# Patient Record
Sex: Female | Born: 2000 | Race: White | Hispanic: No | Marital: Single | Smoking: Former smoker
Health system: Southern US, Community
[De-identification: ages and names within clinical notes are randomized; demographics above are authoritative.]

## PROBLEM LIST (undated history)

## (undated) ENCOUNTER — Inpatient Hospital Stay (HOSPITAL_COMMUNITY): Payer: Self-pay

## (undated) DIAGNOSIS — D649 Anemia, unspecified: Secondary | ICD-10-CM

## (undated) DIAGNOSIS — J45909 Unspecified asthma, uncomplicated: Secondary | ICD-10-CM

## (undated) DIAGNOSIS — Z720 Tobacco use: Secondary | ICD-10-CM

## (undated) HISTORY — DX: Tobacco use: Z72.0

## (undated) HISTORY — DX: Anemia, unspecified: D64.9

---
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
## 2018-04-17 ENCOUNTER — Encounter: Payer: Self-pay | Admitting: Medical

## 2018-04-17 ENCOUNTER — Ambulatory Visit: Payer: 59 | Admitting: Medical

## 2018-04-17 ENCOUNTER — Telehealth: Payer: Self-pay | Admitting: Medical

## 2018-04-17 VITALS — BP 112/70 | HR 112 | Temp 98.1°F | Resp 16 | Ht 66.5 in | Wt 178.4 lb

## 2018-04-17 DIAGNOSIS — F329 Major depressive disorder, single episode, unspecified: Secondary | ICD-10-CM

## 2018-04-17 DIAGNOSIS — R4589 Other symptoms and signs involving emotional state: Secondary | ICD-10-CM

## 2018-04-17 DIAGNOSIS — F419 Anxiety disorder, unspecified: Secondary | ICD-10-CM

## 2018-04-17 DIAGNOSIS — R4586 Emotional lability: Secondary | ICD-10-CM | POA: Diagnosis not present

## 2018-04-17 MED ORDER — CITALOPRAM HYDROBROMIDE 20 MG PO TABS: ORAL_TABLET | ORAL | 2 refills | Status: DC

## 2018-04-17 MED ORDER — ZOLPIDEM TARTRATE 5 MG PO TABS: 5.0000 mg | ORAL_TABLET | Freq: Every evening | ORAL | 0 refills | Status: DC | PRN

## 2018-04-17 NOTE — Progress Notes (Signed)
Subjective: Chief Complaint  Patient presents with  . np anxiety    new/ old pt anxiety    Here to re-establish care.  She has not been here in several years for care.  Her parents encouraged her to come in.  Here to discuss anxiety, mood.  In recent months she notes that she has had anxiety attacks, at times will get trembly, chest tightness will pull over on the highway at times this happens trying to get herself to calm down.  She has no interest in doing anything, has trouble sleeping at times and then other times can sleep for 2 to 3 days not doing much at all around the house.  She notes not having an appetite, no desire to engage with people, keeps to herself of late.  Currently is living in a house with her aunt grandmother and cousin but they are moving out.  Her parents are paying for the house currently in providing her resources for now.  Her parents live in [place].  She talks with them somewhat regularly on the phone but sees them every several months  She notes that she got pregnant at 66 years old, had a baby that had congenital heart defects who was in the hospital all but 1 month of the life ended up passing away 79 months old.  She notes that she has had these feelings of anxiety and lack of desire for things since getting pregnant.  She has never been diagnosed with depression or anxiety or other mental health issues in the past.  She does think that there are mental health issues in the family including sister, grandmother and mother have been diagnosed with anxiety and depression  She tried talking with her pastor who is a Veterinary surgeon but did not feel like that was helpful at all.  She has not seen any other mental health provider.  She is not working or going to school.  Was working at [employer] for a while in 2018, was working [employer] briefly this past year  Not exercising.  She notes that she mainly stays at home.  No SI/no HI. No other aggravating or relieving factors.  No other complaint.  Past Medical History:  Diagnosis Date  . Asthma    No current outpatient medications on file prior to visit.   No current facility-administered medications on file prior to visit.    ROS as in subjective     Objective: BP 112/70   Pulse (!) 112   Temp 98.1 F (36.7 C) (Oral)   Resp 16   Ht 5' 6.5" (1.689 m)   Wt 178 lb 6.4 oz (80.9 kg)   SpO2 98%   BMI 28.36 kg/m   Gen: wd, wn, nad Psych: smiling, good eye contact, but affect seems more apathetic Lungs clear Heart rrr, normal s1, s2, no murmurs Neck: supple, nontender, no mass, no lymphotrophy, no thyromegaly Pulses WNL    Assessment: Encounter Diagnoses  Name Primary?  . Depressed mood Yes  . Anxiety   . Mood swing      Plan: I reviewed her mood questionnaire and PHQ 9.  Negative screen on mood questionnaire.  Her symptoms suggest depressed mood and anxiety, some panic attacks.  She has positive anhedonia, poor sleep hygiene, excessive sleep at times, lack of motivation, mood swings, and somewhat of an apathetic affect.  Strongly encouraged her to consider counseling, discussed the goals and benefits of counseling, discussed possible medicines that may help.  Begin medications  as below, Ambien short-term.  Discussed risk and benefits of medications proper use of medications.  Discussed coping skills for anxiety and ways to deal with acute panic attack.  Encouraged her to find a hobby or consider having to interaction with a good friend regularly.  Plan follow-up in 2 weeks, sooner as needed  Prudence was seen today for np anxiety.  Diagnoses and all orders for this visit:  Depressed mood  Anxiety  Mood swing  Other orders -     citalopram (CELEXA) 20 MG tablet; 1/2 tablet daily QHS x1wk, then 1 tablet po QHS -     zolpidem (AMBIEN) 5 MG tablet; Take 1 tablet (5 mg total) by mouth at bedtime as needed for sleep.

## 2018-04-17 NOTE — Telephone Encounter (Signed)
Pt's mom, Kristin Sutton, called stating that pt has been experiencing severe anxiety and/or depression since she loss her baby a few months ago so that is why mom made the appointment for pt to be seen today. Mom wants to make sure that Kristin Sutton encourages pt to come back for a CPE because mom has been told that pt has a lump in her breast and vaginal area bumps so she needs a complete CPE. Per mom, pt has let herself go mentally and physically and she is trying to help get her on track. Mom does not want pt to know that she revealed of of this information to [name].

## 2018-04-17 NOTE — Telephone Encounter (Signed)
Please call mom back and tell her thank you for call and check in on her.  We did have a relatively good discussion today, and I asked her to come back in 2 weeks for follow-up.  However yes I also agree she needs help.  Have mom encourage her to establish with a therapist as I did as well.   Have mom encourage her to come back for physical.  Tammy - please call her back and make her a 2 week follow up appt on mood or physical appt.

## 2018-04-17 NOTE — Patient Instructions (Signed)
RESOURCES in [place], [place]  If you are experiencing a mental health crisis or an emergency, please call 911 or go to the nearest emergency department.  [facility]   [phone] [facility]  [phone] [facility]   [phone]  Suicide Hotline 1-800-Suicide (1-800-784-2433)  National Suicide Prevention Lifeline 1-800-273-TALK  (1-800-273-8255)  Domestic Violence, Rape/Crisis - [facility] 336-273-7273  The National Domestic Violence Hotline 1-800-799-SAFE (1-800-799-7233)  To report Child or Elder Abuse, please call: [place] [employer] Department  [phone] [place] [employer]  [phone]  LGBT Youth Crisis Line 1-866-488-7386  Teen Crisis line 336-387-6161 or 1-877-332-7333     Psychiatry and Counseling services  [facility] [address], [place], [place] [address] [phone]  Holly Ingram, therapist Dr. Carey Cottle, psychiatrist Dr. Glenn Jennings, child psychiatrist   Dr. David Fuller [address], [place], [place] [address] [phone]   Dr. Rupinder Kaur, psychiatry [address], [place], [place] [address] [phone]   [facility] [address], [place], [place] [address] [phone]   [facility] [address], [place], [place] [address] [phone]    Counseling Services (NON- psychiatrist offices)  [facility] [address], [place], [place] [address] [phone]   [facility] [phone] [address], [place], [place] [address]   [facility] [phone]  [email or web address] [address], [place], [place] [address]   Merrianne M. Leff, therapist [phone] [address] [place], [place] [address]   [facility] [phone] [address], [place],   [address]   Jill White-Huffman, therapist [phone] [address], [place],  [address]   [facility] [phone] [address], [place],  [address]   

## 2018-04-21 NOTE — Telephone Encounter (Signed)
Have called mom twice and left messages to call our office

## 2018-04-24 ENCOUNTER — Ambulatory Visit: Payer: 59 | Admitting: Medical

## 2018-04-24 VITALS — BP 110/70 | HR 95 | Temp 98.1°F | Resp 16 | Ht 66.5 in | Wt 181.8 lb

## 2018-04-24 DIAGNOSIS — L03011 Cellulitis of right finger: Secondary | ICD-10-CM | POA: Diagnosis not present

## 2018-04-24 DIAGNOSIS — F321 Major depressive disorder, single episode, moderate: Secondary | ICD-10-CM | POA: Diagnosis not present

## 2018-04-24 DIAGNOSIS — F419 Anxiety disorder, unspecified: Secondary | ICD-10-CM

## 2018-04-24 MED ORDER — AMOXICILLIN-POT CLAVULANATE 875-125 MG PO TABS: 1.0000 | ORAL_TABLET | Freq: Two times a day (BID) | ORAL | 0 refills | Status: DC

## 2018-04-24 NOTE — Patient Instructions (Signed)
Paronychia  Paronychia is an infection of the skin. It happens near a fingernail or toenail. It may cause pain and swelling around the nail. Usually, it is not serious and it clears up with treatment.  Follow these instructions at home:   Soak the fingers or toes in warm water as told by your doctor. You may be told to do this for 20 minutes, 2-3 times a day.   Keep the area dry when you are not soaking it.   Take medicines only as told by your doctor.   If you were given an antibiotic medicine, finish all of it even if you start to feel better.   Keep the affected area clean.   Do not try to drain a fluid-filled bump yourself.   Wear rubber gloves when putting your hands in water.   Wear gloves if your hands might touch cleaners or chemicals.   Follow your doctor's instructions about:  ? Wound care.  ? Bandage (dressing) changes and removal.  Contact a doctor if:   Your symptoms get worse or do not improve.   You have a fever or chills.   You have redness spreading from the affected area.   You have more fluid, blood, or pus coming from the affected area.   Your finger or knuckle is swollen or is hard to move.  This information is not intended to replace advice given to you by your health care provider. Make sure you discuss any questions you have with your health care provider.  Document Released: 07/17/2009 Document Revised: 01/04/2016 Document Reviewed: 07/06/2014  Elsevier Interactive Patient Education  2018 Elsevier Inc.

## 2018-04-24 NOTE — Progress Notes (Signed)
Subjective: Chief Complaint  Patient presents with  . infected finger    right ring finger infected X 2 weeks   Here for right finger infection.  She was biting her nails couple weeks ago started getting redness and swelling of the right ring finger.  It has worsened in the last week, getting pus out of it daily.  She is using warm soaks.  No fever.  Since last visit she called a few counselors, but has not yet made an appointment, she started the medication to help with depression and is having some nausea but is still taking this  Past Medical History:  Diagnosis Date  . Asthma    Current Outpatient Medications on File Prior to Visit  Medication Sig Dispense Refill  . citalopram (CELEXA) 20 MG tablet 1/2 tablet daily QHS x1wk, then 1 tablet po QHS 30 tablet 2  . zolpidem (AMBIEN) 5 MG tablet Take 1 tablet (5 mg total) by mouth at bedtime as needed for sleep. 15 tablet 0   No current facility-administered medications on file prior to visit.    ROS as in subjective   Objective: BP 110/70   Pulse 95   Temp 98.1 F (36.7 C) (Oral)   Resp 16   Ht 5' 6.5" (1.689 m)   Wt 181 lb 12.8 oz (82.5 kg)   SpO2 98%   BMI 28.90 kg/m    Gen:wd, wn, nad Skin: medial nail bed of right 4th finger with redness, swelling, quite tender but no pus pocket currently Fingers neurovascularly intact Psych: Pleasant, good eye contact, answers questions appropriately   Assessment: Encounter Diagnoses  Name Primary?  . Paronychia of finger of right hand Yes  . Anxiety   . Depression, major, single episode, moderate (HCC)     Plan: Continue hot soapy water soaks of the finger 2-3 times daily, begin medication below, can use Neosporin topically, and if not much improved within 10 days call back  Anxiety, depression -glad that she is seeing some improvement in mood, has been more receptive to talking about her concerns in the last week, continue citalopram, again encouraged her to get a counselor  ASAP.   Bonner PunaJazlyn was seen today for infected finger.  Diagnoses and all orders for this visit:  Paronychia of finger of right hand  Anxiety  Depression, major, single episode, moderate (HCC)  Other orders -     amoxicillin-clavulanate (AUGMENTIN) 875-125 MG tablet; Take 1 tablet by mouth 2 (two) times daily.

## 2018-05-12 ENCOUNTER — Ambulatory Visit: Payer: 59 | Admitting: Medical

## 2018-05-12 ENCOUNTER — Encounter: Payer: Self-pay | Admitting: Medical

## 2018-05-12 VITALS — BP 110/70 | HR 91 | Temp 97.6°F | Resp 16 | Ht 67.0 in | Wt 182.6 lb

## 2018-05-12 DIAGNOSIS — N63 Unspecified lump in unspecified breast: Secondary | ICD-10-CM | POA: Insufficient documentation

## 2018-05-12 DIAGNOSIS — Z0000 Encounter for general adult medical examination without abnormal findings: Secondary | ICD-10-CM

## 2018-05-12 DIAGNOSIS — N926 Irregular menstruation, unspecified: Secondary | ICD-10-CM | POA: Diagnosis not present

## 2018-05-12 DIAGNOSIS — Z8742 Personal history of other diseases of the female genital tract: Secondary | ICD-10-CM | POA: Diagnosis not present

## 2018-05-12 DIAGNOSIS — Z3201 Encounter for pregnancy test, result positive: Secondary | ICD-10-CM | POA: Insufficient documentation

## 2018-05-12 DIAGNOSIS — N912 Amenorrhea, unspecified: Secondary | ICD-10-CM

## 2018-05-12 DIAGNOSIS — K5909 Other constipation: Secondary | ICD-10-CM

## 2018-05-12 DIAGNOSIS — R102 Pelvic and perineal pain: Secondary | ICD-10-CM

## 2018-05-12 DIAGNOSIS — F321 Major depressive disorder, single episode, moderate: Secondary | ICD-10-CM

## 2018-05-12 DIAGNOSIS — Z113 Encounter for screening for infections with a predominantly sexual mode of transmission: Secondary | ICD-10-CM | POA: Diagnosis not present

## 2018-05-12 DIAGNOSIS — Z720 Tobacco use: Secondary | ICD-10-CM

## 2018-05-12 HISTORY — PX: NO PAST SURGERIES: SHX2092

## 2018-05-12 LAB — POCT URINALYSIS DIP (PROADVANTAGE DEVICE)
BILIRUBIN UA: NEGATIVE
Blood, UA: NEGATIVE
Glucose, UA: NEGATIVE mg/dL
Ketones, POC UA: NEGATIVE mg/dL
Leukocytes, UA: NEGATIVE
NITRITE UA: NEGATIVE
PH UA: 6 (ref 5.0–8.0)
PROTEIN UA: NEGATIVE mg/dL
Specific Gravity, Urine: 1.03
Urobilinogen, Ur: NEGATIVE

## 2018-05-12 LAB — POCT URINE PREGNANCY: Preg Test, Ur: POSITIVE — AB

## 2018-05-12 MED ORDER — PRENATAL VITAMINS 28-0.8 MG PO TABS: 1.0000 | ORAL_TABLET | Freq: Every day | ORAL | 11 refills | Status: AC

## 2018-05-12 MED ORDER — ALBUTEROL SULFATE HFA 108 (90 BASE) MCG/ACT IN AERS: 2.0000 | INHALATION_SPRAY | Freq: Four times a day (QID) | RESPIRATORY_TRACT | 0 refills | Status: AC | PRN

## 2018-05-12 NOTE — Progress Notes (Signed)
Subjective:   HPI  Kristin Sutton is a 17 y.o. female who presents for Chief Complaint  Patient presents with  . cpe    cpe   Medical care team includes: Tysinger, Kermit Balo, PA-C here for primary care Dentist  Concerns: Accompanied by her boyfriend today during part of the visit  She has no idea where her vaccine records are not sure about last tetanus  She was born in [place]  She is having some lower abdominal discomfort in the last day.  Denies any urinary symptoms or vaginal symptoms.  She does note long-standing constipation, has a bowel movement 1-2 times per week.  No recent diarrhea no blood in stool no blood in the urine.  No prior treatment for constipation  She has lived on her own since age 48  Her last period was at the beginning of September prior to that always regular and not heavy.  Not on birth control.  She had a positive pregnancy test over the weekend. sexually active with the same partner of her daughter  She has had one prior pregnancy, and the child had a congenital heart defects who later passed away about 73 months old  She has had a breast lump for a while which was evaluated a few months ago.  She has nipple rings and gets greenish to white discharge from the nose occasionally  She notes that she does not exercise, does not eat a lot of vegetables.    She has a history of asthma, recently has been vaping but due to some cough for the weekend she is worried about her lungs, does not plan to continue vaping.  Not currently using inhaler  She has been taking citalopram with good response however given her current issues with positive pregnancy test worried about taking this  Reviewed their medical, surgical, family, social, medication, and allergy history and updated chart as appropriate.  Past Medical History:  Diagnosis Date  . Anemia    history of iron deficiency  . Asthma   . Smokeless tobacco use    vape    Past Surgical History:   Procedure Laterality Date  . NO PAST SURGERIES  05/2018    Social History   Socioeconomic History  . Marital status: Single    Spouse name: Not on file  . Number of children: Not on file  . Years of education: Not on file  . Highest education level: Not on file  Occupational History  . Occupation: 7th grade - [employer]  Social Needs  . Financial resource strain: Not on file  . Food insecurity:    Worry: Not on file    Inability: Not on file  . Transportation needs:    Medical: Not on file    Non-medical: Not on file  Tobacco Use  . Smoking status: Former Smoker    Types: Cigarettes  . Smokeless tobacco: Never Used  . Tobacco comment: juul  Substance and Sexual Activity  . Alcohol use: No  . Drug use: No  . Sexual activity: Not on file  Lifestyle  . Physical activity:    Days per week: Not on file    Minutes per session: Not on file  . Stress: Not on file  Relationships  . Social connections:    Talks on phone: Not on file    Gets together: Not on file    Attends religious service: Not on file    Active member of club or organization: Not on file  Attends meetings of clubs or organizations: Not on file    Relationship status: Not on file  . Intimate partner violence:    Fear of current or ex partner: Not on file    Emotionally abused: Not on file    Physically abused: Not on file    Forced sexual activity: Not on file  Other Topics Concern  . Not on file  Social History Narrative   Lives alone.    Parents live in [place], 9 half siblings.  Worked at [employer] and [employer] prior. Has high school diploma.   Starts college online next month.   05/2018.       Family History  Problem Relation Age of Onset  . Cholelithiasis Mother   . Hypertension Mother   . Cancer Maternal Aunt        breast  . Heart disease Maternal Grandmother   . Drug abuse Maternal Grandmother   . Hypertension Other   . Hyperlipidemia Other   . Heart disease Daughter         hypoplastic left heart, double right ventricle, pulmonary stenosis, other congenital heart disease  . Cancer Paternal Grandfather        skin  . Stroke Neg Hx      Current Outpatient Medications:  .  amoxicillin-clavulanate (AUGMENTIN) 875-125 MG tablet, Take 1 tablet by mouth 2 (two) times daily. (Patient not taking: Reported on 05/12/2018), Disp: 20 tablet, Rfl: 0 .  citalopram (CELEXA) 20 MG tablet, 1/2 tablet daily QHS x1wk, then 1 tablet po QHS (Patient not taking: Reported on 05/12/2018), Disp: 30 tablet, Rfl: 2 .  Prenatal Vit-Fe Fumarate-FA (PRENATAL VITAMINS) 28-0.8 MG TABS, Take 1 tablet by mouth daily., Disp: 30 tablet, Rfl: 11 .  zolpidem (AMBIEN) 5 MG tablet, Take 1 tablet (5 mg total) by mouth at bedtime as needed for sleep. (Patient not taking: Reported on 05/12/2018), Disp: 15 tablet, Rfl: 0  No Known Allergies   Review of Systems Constitutional: -fever, -chills, -sweats, -unexpected weight change, -decreased appetite, -fatigue Allergy: -sneezing, -itching, -congestion Dermatology: -changing moles, --rash, -lumps ENT: -runny nose, -ear pain, -sore throat, -hoarseness, -sinus pain, -teeth pain, - ringing in ears, -hearing loss, -nosebleeds Cardiology: -chest pain, -palpitations, -swelling, -difficulty breathing when lying flat, -waking up short of breath Respiratory: -cough, -shortness of breath, -difficulty breathing with exercise or exertion, -wheezing, -coughing up blood Gastroenterology: +abdominal pain, -nausea, -vomiting, -diarrhea, +constipation, -blood in stool, -changes in bowel movement, -difficulty swallowing or eating Hematology: -bleeding, -bruising  Musculoskeletal: -joint aches, -muscle aches, -joint swelling, -back pain, -neck pain, -cramping, -changes in gait Ophthalmology: denies vision changes, eye redness, itching, discharge Urology: -burning with urination, -difficulty urinating, -blood in urine, -urinary frequency, -urgency, -incontinence Neurology:  -headache, -weakness, -tingling, -numbness, -memory loss, -falls, -dizziness Psychology: -depressed mood, -agitation, -sleep problems Breast/gyn: -breast tenderness, -discharge, -lumps, -vaginal discharge,- irregular periods, -heavy periods     Objective:  BP 110/70   Pulse 91   Temp 97.6 F (36.4 C) (Oral)   Resp 16   Ht 5\' 7"  (1.702 m)   Wt 182 lb 9.6 oz (82.8 kg)   SpO2 98%   BMI 28.60 kg/m   General appearance: alert, no distress, WD/WN, Caucasian female Skin: unremarkable other than small hematoma of right neck (aka hickey) HEENT: normocephalic, conjunctiva/corneas normal, sclerae anicteric, PERRLA, EOMi, nares patent, no discharge or erythema, pharynx normal Oral cavity: MMM, tongue normal, teeth in good repair Neck: supple, no lymphadenopathy, no thyromegaly, no masses, normal ROM, no bruits Chest: non  tender, normal shape and expansion Heart: RRR, normal S1, S2, no murmurs Lungs: CTA bilaterally, no wheezes, rhonchi, or rales Abdomen: +bs, soft, non tender, non distended, no masses, no hepatomegaly, no splenomegaly, no bruits Back: non tender, normal ROM, no scoliosis Musculoskeletal: upper extremities non tender, no obvious deformity, normal ROM throughout, lower extremities non tender, no obvious deformity, normal ROM throughout Extremities: no edema, no cyanosis, no clubbing Pulses: 2+ symmetric, upper and lower extremities, normal cap refill Neurological: alert, oriented x 3, CN2-12 intact, strength normal upper extremities and lower extremities, sensation normal throughout, DTRs 2+ throughout, no cerebellar signs, gait normal Psychiatric: normal affect, behavior normal, pleasant  Breast/gyn/rectal - deferred to gynecology, declined exam  Assessment and Plan :   Encounter Diagnoses  Name Primary?  . Routine general medical examination at a health care facility Yes  . Missed period   . History of abnormal cervical Pap smear   . Pelvic pain   . Screen for STD  (sexually transmitted disease)   . Breast lump   . Chronic constipation   . Pregnancy test positive   . Tobacco use   . Depression, major, single episode, moderate (HCC)   . Amenorrhea     Physical exam - discussed and counseled on healthy lifestyle, diet, exercise, preventative care, vaccinations, sick and well care, proper use of emergency dept and after hours care, and addressed their concerns.    Health screening: See your eye doctor yearly for routine vision care. See your dentist yearly for routine dental care including hygiene visits twice yearly.  Vaccinations: Advised yearly influenza vaccine Patient declines influenza vaccine   Separate significant issues discussed: +pregnancy - prior pregnancy with live birth.  Daughter with congential heart defects, died at 59mo.   Referral to OB/Gyn  Breast lump - advised f/u with gynecology  Hx/o abnormal pap - advised f/u with gynecology  Pelvic pain - f/u with gynecology  Hx/o anemia - labs today  Constipation - incresae fiber intake, c/t good water intake  Asthma-  Advised albuterol prn, stop smoking or using tobacco products!  given the pregnancy, she has already stopped citalopram and Ambien  Depression - c/t counseling  Paysen was seen today for cpe.  Diagnoses and all orders for this visit:  Routine general medical examination at a health care facility -     POCT Urinalysis DIP (Proadvantage Device) -     Comprehensive metabolic panel -     CBC with Differential/Platelet -     Lipid panel -     TSH -     HIV Antibody (routine testing w rflx) -     RPR -     GC/Chlamydia Probe Amp  Missed period -     Ambulatory referral to Obstetrics / Gynecology  History of abnormal cervical Pap smear  Pelvic pain -     CBC with Differential/Platelet -     HIV Antibody (routine testing w rflx) -     RPR -     GC/Chlamydia Probe Amp -     Ambulatory referral to Obstetrics / Gynecology  Screen for STD (sexually  transmitted disease) -     HIV Antibody (routine testing w rflx) -     RPR -     GC/Chlamydia Probe Amp  Breast lump -     Ambulatory referral to Obstetrics / Gynecology  Chronic constipation -     Comprehensive metabolic panel -     TSH  Pregnancy test positive -  Beta hCG quant (ref lab) -     Ambulatory referral to Obstetrics / Gynecology  Tobacco use  Depression, major, single episode, moderate (HCC)  Amenorrhea Comments: error Orders: -     POCT urine pregnancy  Other orders -     Prenatal Vit-Fe Fumarate-FA (PRENATAL VITAMINS) 28-0.8 MG TABS; Take 1 tablet by mouth daily.    Follow-up pending labs, yearly for physical

## 2018-05-12 NOTE — Patient Instructions (Signed)
First Trimester of Pregnancy The first trimester of pregnancy is from week 1 until the end of week 13 (months 1 through 3). During this time, your baby will begin to develop inside you. At 6-8 weeks, the eyes and face are formed, and the heartbeat can be seen on ultrasound. At the end of 12 weeks, all the baby's organs are formed. Prenatal care is all the medical care you receive before the birth of your baby. Make sure you get good prenatal care and follow all of your doctor's instructions. Follow these instructions at home: Medicines  Take over-the-counter and prescription medicines only as told by your doctor. Some medicines are safe and some medicines are not safe during pregnancy.  Take a prenatal vitamin that contains at least 600 micrograms (mcg) of folic acid.  If you have trouble pooping (constipation), take medicine that will make your stool soft (stool softener) if your doctor approves. Eating and drinking  Eat regular, healthy meals.  Your doctor will tell you the amount of weight gain that is right for you.  Avoid raw meat and uncooked cheese.  If you feel sick to your stomach (nauseous) or throw up (vomit): ? Eat 4 or 5 small meals a day instead of 3 large meals. ? Try eating a few soda crackers. ? Drink liquids between meals instead of during meals.  To prevent constipation: ? Eat foods that are high in fiber, like fresh fruits and vegetables, whole grains, and beans. ? Drink enough fluids to keep your pee (urine) clear or pale yellow. Activity  Exercise only as told by your doctor. Stop exercising if you have cramps or pain in your lower belly (abdomen) or low back.  Do not exercise if it is too hot, too humid, or if you are in a place of great height (high altitude).  Try to avoid standing for long periods of time. Move your legs often if you must stand in one place for a long time.  Avoid heavy lifting.  Wear low-heeled shoes. Sit and stand up straight.  You  can have sex unless your doctor tells you not to. Relieving pain and discomfort  Wear a good support bra if your breasts are sore.  Take warm water baths (sitz baths) to soothe pain or discomfort caused by hemorrhoids. Use hemorrhoid cream if your doctor says it is okay.  Rest with your legs raised if you have leg cramps or low back pain.  If you have puffy, bulging veins (varicose veins) in your legs: ? Wear support hose or compression stockings as told by your doctor. ? Raise (elevate) your feet for 15 minutes, 3-4 times a day. ? Limit salt in your food. Prenatal care  Schedule your prenatal visits by the twelfth week of pregnancy.  Write down your questions. Take them to your prenatal visits.  Keep all your prenatal visits as told by your doctor. This is important. Safety  Wear your seat belt at all times when driving.  Make a list of emergency phone numbers. The list should include numbers for family, friends, the hospital, and police and fire departments. General instructions  Ask your doctor for a referral to a local prenatal class. Begin classes no later than at the start of month 6 of your pregnancy.  Ask for help if you need counseling or if you need help with nutrition. Your doctor can give you advice or tell you where to go for help.  Do not use hot tubs, steam rooms, or   saunas.  Do not douche or use tampons or scented sanitary pads.  Do not cross your legs for long periods of time.  Avoid all herbs and alcohol. Avoid drugs that are not approved by your doctor.  Do not use any tobacco products, including cigarettes, chewing tobacco, and electronic cigarettes. If you need help quitting, ask your doctor. You may get counseling or other support to help you quit.  Avoid cat litter boxes and soil used by cats. These carry germs that can cause birth defects in the baby and can cause a loss of your baby (miscarriage) or stillbirth.  Visit your dentist. At home, brush  your teeth with a soft toothbrush. Be gentle when you floss. Contact a doctor if:  You are dizzy.  You have mild cramps or pressure in your lower belly.  You have a nagging pain in your belly area.  You continue to feel sick to your stomach, you throw up, or you have watery poop (diarrhea).  You have a bad smelling fluid coming from your vagina.  You have pain when you pee (urinate).  You have increased puffiness (swelling) in your face, hands, legs, or ankles. Get help right away if:  You have a fever.  You are leaking fluid from your vagina.  You have spotting or bleeding from your vagina.  You have very bad belly cramping or pain.  You gain or lose weight rapidly.  You throw up blood. It may look like coffee grounds.  You are around people who have German measles, fifth disease, or chickenpox.  You have a very bad headache.  You have shortness of breath.  You have any kind of trauma, such as from a fall or a car accident. Summary  The first trimester of pregnancy is from week 1 until the end of week 13 (months 1 through 3).  To take care of yourself and your unborn baby, you will need to eat healthy meals, take medicines only if your doctor tells you to do so, and do activities that are safe for you and your baby.  Keep all follow-up visits as told by your doctor. This is important as your doctor will have to ensure that your baby is healthy and growing well. This information is not intended to replace advice given to you by your health care provider. Make sure you discuss any questions you have with your health care provider. Document Released: 01/15/2008 Document Revised: 08/06/2016 Document Reviewed: 08/06/2016 Elsevier Interactive Patient Education  2017 Elsevier Inc.  

## 2018-05-13 ENCOUNTER — Encounter: Payer: Self-pay | Admitting: Obstetrics and Gynecology

## 2018-05-13 LAB — CBC WITH DIFFERENTIAL/PLATELET
Basophils Absolute: 0 10*3/uL (ref 0.0–0.2)
Basos: 1 %
EOS (ABSOLUTE): 0.2 10*3/uL (ref 0.0–0.4)
EOS: 3 %
HEMATOCRIT: 29.5 % — AB (ref 34.0–46.6)
HEMOGLOBIN: 8.9 g/dL — AB (ref 11.1–15.9)
IMMATURE GRANULOCYTES: 0 %
Immature Grans (Abs): 0 10*3/uL (ref 0.0–0.1)
Lymphocytes Absolute: 1.5 10*3/uL (ref 0.7–3.1)
Lymphs: 21 %
MCH: 22.6 pg — ABNORMAL LOW (ref 26.6–33.0)
MCHC: 30.2 g/dL — ABNORMAL LOW (ref 31.5–35.7)
MCV: 75 fL — ABNORMAL LOW (ref 79–97)
MONOCYTES: 6 %
Monocytes Absolute: 0.5 10*3/uL (ref 0.1–0.9)
NEUTROS PCT: 69 %
Neutrophils Absolute: 4.8 10*3/uL (ref 1.4–7.0)
Platelets: 446 10*3/uL (ref 150–450)
RBC: 3.93 x10E6/uL (ref 3.77–5.28)
RDW: 15.4 % (ref 12.3–15.4)
WBC: 7 10*3/uL (ref 3.4–10.8)

## 2018-05-13 LAB — HIV ANTIBODY (ROUTINE TESTING W REFLEX): HIV Screen 4th Generation wRfx: NONREACTIVE

## 2018-05-13 LAB — BETA HCG QUANT (REF LAB): hCG Quant: 763 m[IU]/mL

## 2018-05-13 LAB — COMPREHENSIVE METABOLIC PANEL
ALK PHOS: 85 IU/L (ref 39–117)
ALT: 17 IU/L (ref 0–32)
AST: 15 IU/L (ref 0–40)
Albumin/Globulin Ratio: 1.6 (ref 1.2–2.2)
Albumin: 4.4 g/dL (ref 3.5–5.5)
BUN/Creatinine Ratio: 15 (ref 9–23)
BUN: 11 mg/dL (ref 6–20)
Bilirubin Total: 0.2 mg/dL (ref 0.0–1.2)
CO2: 21 mmol/L (ref 20–29)
CREATININE: 0.73 mg/dL (ref 0.57–1.00)
Calcium: 9.7 mg/dL (ref 8.7–10.2)
Chloride: 101 mmol/L (ref 96–106)
GFR calc Af Amer: 138 mL/min/{1.73_m2} (ref >59)
GFR calc non Af Amer: 120 mL/min/{1.73_m2} (ref >59)
GLUCOSE: 78 mg/dL (ref 65–99)
Globulin, Total: 2.8 g/dL (ref 1.5–4.5)
Potassium: 5.1 mmol/L (ref 3.5–5.2)
Sodium: 140 mmol/L (ref 134–144)
Total Protein: 7.2 g/dL (ref 6.0–8.5)

## 2018-05-13 LAB — TSH: TSH: 1.54 u[IU]/mL (ref 0.450–4.500)

## 2018-05-13 LAB — LIPID PANEL
CHOL/HDL RATIO: 2.4 ratio (ref 0.0–4.4)
Cholesterol, Total: 129 mg/dL (ref 100–169)
HDL: 54 mg/dL (ref >39)
LDL CALC: 60 mg/dL (ref 0–109)
Triglycerides: 74 mg/dL (ref 0–89)
VLDL Cholesterol Cal: 15 mg/dL (ref 5–40)

## 2018-05-13 LAB — RPR: RPR: NONREACTIVE

## 2018-05-15 LAB — GC/CHLAMYDIA PROBE AMP
Chlamydia trachomatis, NAA: NEGATIVE
Neisseria gonorrhoeae by PCR: NEGATIVE

## 2018-05-24 ENCOUNTER — Encounter (HOSPITAL_COMMUNITY): Payer: Self-pay

## 2018-05-24 ENCOUNTER — Inpatient Hospital Stay (HOSPITAL_COMMUNITY)
Admission: AD | Admit: 2018-05-24 | Discharge: 2018-05-24 | Disposition: A | Discharge status: Home / Self Care | Payer: 59 | Source: Ambulatory Visit | Attending: Obstetrics and Gynecology | Admitting: Obstetrics and Gynecology

## 2018-05-24 ENCOUNTER — Inpatient Hospital Stay (HOSPITAL_COMMUNITY): Payer: 59

## 2018-05-24 DIAGNOSIS — Z87891 Personal history of nicotine dependence: Secondary | ICD-10-CM | POA: Insufficient documentation

## 2018-05-24 DIAGNOSIS — Z3A01 Less than 8 weeks gestation of pregnancy: Secondary | ICD-10-CM

## 2018-05-24 DIAGNOSIS — O26891 Other specified pregnancy related conditions, first trimester: Secondary | ICD-10-CM | POA: Diagnosis not present

## 2018-05-24 DIAGNOSIS — Z3A10 10 weeks gestation of pregnancy: Secondary | ICD-10-CM | POA: Insufficient documentation

## 2018-05-24 DIAGNOSIS — O208 Other hemorrhage in early pregnancy: Secondary | ICD-10-CM | POA: Insufficient documentation

## 2018-05-24 DIAGNOSIS — R109 Unspecified abdominal pain: Secondary | ICD-10-CM | POA: Diagnosis present

## 2018-05-24 DIAGNOSIS — O99011 Anemia complicating pregnancy, first trimester: Secondary | ICD-10-CM | POA: Insufficient documentation

## 2018-05-24 DIAGNOSIS — D509 Iron deficiency anemia, unspecified: Secondary | ICD-10-CM

## 2018-05-24 DIAGNOSIS — O468X1 Other antepartum hemorrhage, first trimester: Secondary | ICD-10-CM

## 2018-05-24 DIAGNOSIS — Z3491 Encounter for supervision of normal pregnancy, unspecified, first trimester: Secondary | ICD-10-CM

## 2018-05-24 DIAGNOSIS — O418X10 Other specified disorders of amniotic fluid and membranes, first trimester, not applicable or unspecified: Secondary | ICD-10-CM | POA: Diagnosis not present

## 2018-05-24 LAB — HCG, QUANTITATIVE, PREGNANCY: hCG, Beta Chain, Quant, S: 39039 m[IU]/mL — ABNORMAL HIGH (ref <5)

## 2018-05-24 LAB — CBC
HCT: 28.2 % — ABNORMAL LOW (ref 36.0–46.0)
Hemoglobin: 8.4 g/dL — ABNORMAL LOW (ref 12.0–15.0)
MCH: 22.5 pg — ABNORMAL LOW (ref 26.0–34.0)
MCHC: 29.8 g/dL — ABNORMAL LOW (ref 30.0–36.0)
MCV: 75.6 fL — ABNORMAL LOW (ref 80.0–100.0)
Platelets: 421 10*3/uL — ABNORMAL HIGH (ref 150–400)
RBC: 3.73 MIL/uL — ABNORMAL LOW (ref 3.87–5.11)
RDW: 16.6 % — ABNORMAL HIGH (ref 11.5–15.5)
WBC: 10.1 10*3/uL (ref 4.0–10.5)
nRBC: 0 % (ref 0.0–0.2)

## 2018-05-24 LAB — WET PREP, GENITAL
Clue Cells Wet Prep HPF POC: NONE SEEN
Sperm: NONE SEEN
Trich, Wet Prep: NONE SEEN
Yeast Wet Prep HPF POC: NONE SEEN

## 2018-05-24 LAB — POCT PREGNANCY, URINE: PREG TEST UR: POSITIVE — AB

## 2018-05-24 LAB — URINALYSIS, ROUTINE W REFLEX MICROSCOPIC
BILIRUBIN URINE: NEGATIVE
Glucose, UA: NEGATIVE mg/dL
Hgb urine dipstick: NEGATIVE
KETONES UR: NEGATIVE mg/dL
Nitrite: NEGATIVE
Protein, ur: NEGATIVE mg/dL
SPECIFIC GRAVITY, URINE: 1.01 (ref 1.005–1.030)
pH: 8 (ref 5.0–8.0)

## 2018-05-24 LAB — ABO/RH: ABO/RH(D): O POS

## 2018-05-24 MED ORDER — ACETAMINOPHEN 500 MG PO TABS: 1000.0000 mg | ORAL_TABLET | Freq: Once | ORAL | Status: DC

## 2018-05-24 NOTE — MAU Note (Addendum)
Cramping for a week  N/V for 2 weeks, states she saw blood in vomit  Saw some blood on toilet paper about 3 days ago  Also feeling lightheaded  States she fell in shower last night

## 2018-05-24 NOTE — MAU Provider Note (Signed)
History     CSN: [id number]  Arrival date and time: 05/24/18 1705   First Provider Initiated Contact with Patient 05/24/18 1841      Chief Complaint  Patient presents with  . Abdominal Pain  . Nausea  . Emesis  . Dizziness   Kristin Sutton is a 17 y.o. G3P1 at [redacted]w[redacted]d by LMP who presents to MAU with complaints of abdominal pain, vaginal bleeding, dizziness and N/V. She reports symptoms started occurring three days ago. She describes abdominal pain as lower abdominal cramping that is constant, rates pain 4/10- has not taken any medication for abdominal pain. She reports vaginal bleeding started after intercourse 3 days ago, describes as dark red vaginal spotting when she wipes. Denies having to wear a pad or panty liner for bleeding. N/V has been present since she found out she was pregnant, denies having issues keeping food or liquid down and reports vomiting twice in the past 24 hours. She denies having medication at home for N/V. She reports hx of hgb of 7.5 and denies taking iron supplements for anemia but reports occasional dizziness prior to being diagnosed with anemia. She reports passing out in the shower the other day after becoming dizzy.    OB History    Gravida  3   Para  1   Term  1   Preterm      AB  1   Living        SAB  1   TAB      Ectopic      Multiple      Live Births  1           Past Medical History:  Diagnosis Date  . Anemia    history of iron deficiency  . Asthma   . Smokeless tobacco use    vape    Past Surgical History:  Procedure Laterality Date  . NO PAST SURGERIES  05/2018    Family History  Problem Relation Age of Onset  . Cholelithiasis Mother   . Hypertension Mother   . Cancer Maternal Aunt        breast  . Heart disease Maternal Grandmother   . Drug abuse Maternal Grandmother   . Hypertension Other   . Hyperlipidemia Other   . Heart disease Daughter        hypoplastic left heart, double right ventricle, pulmonary  stenosis, other congenital heart disease  . Cancer Paternal Grandfather        skin  . Stroke Neg Hx     Social History   Tobacco Use  . Smoking status: Former Smoker    Types: Cigarettes  . Smokeless tobacco: Never Used  . Tobacco comment: juul  Substance Use Topics  . Alcohol use: No  . Drug use: No    Allergies: No Known Allergies  Medications Prior to Admission  Medication Sig Dispense Refill Last Dose  . albuterol (PROVENTIL HFA;VENTOLIN HFA) 108 (90 Base) MCG/ACT inhaler Inhale 2 puffs into the lungs every 6 (six) hours as needed for wheezing or shortness of breath. 1 Inhaler 0   . amoxicillin-clavulanate (AUGMENTIN) 875-125 MG tablet Take 1 tablet by mouth 2 (two) times daily. (Patient not taking: Reported on 05/12/2018) 20 tablet 0 Not Taking  . citalopram (CELEXA) 20 MG tablet 1/2 tablet daily QHS x1wk, then 1 tablet po QHS (Patient not taking: Reported on 05/12/2018) 30 tablet 2 Not Taking  . Prenatal Vit-Fe Fumarate-FA (PRENATAL VITAMINS) 28-0.8 MG TABS Take 1 tablet  by mouth daily. 30 tablet 11   . zolpidem (AMBIEN) 5 MG tablet Take 1 tablet (5 mg total) by mouth at bedtime as needed for sleep. (Patient not taking: Reported on 05/12/2018) 15 tablet 0 Not Taking    Review of Systems  Constitutional: Negative.   Respiratory: Negative.   Cardiovascular: Negative.   Gastrointestinal: Positive for abdominal pain, nausea and vomiting. Negative for constipation and diarrhea.  Genitourinary: Positive for vaginal bleeding. Negative for difficulty urinating, dysuria, frequency, pelvic pain and urgency.  Musculoskeletal: Negative.   Neurological: Positive for dizziness and syncope. Negative for light-headedness and headaches.   Physical Exam   Blood pressure (!) 86/57, pulse 90, temperature 98.6 F (37 C), temperature source Oral, resp. rate 18, weight 86.1 kg, last menstrual period 03/12/2018, unknown if currently breastfeeding.  Physical Exam  Nursing note and vitals  reviewed. Constitutional: She is oriented to person, place, and time. She appears well-developed and well-nourished. No distress.  Cardiovascular: Normal rate, regular rhythm and normal heart sounds.  Respiratory: Effort normal. No respiratory distress. She has no wheezes.  GI: Soft. Bowel sounds are normal. She exhibits no distension. There is no tenderness. There is no rebound.  Genitourinary: Cervix exhibits no motion tenderness. No bleeding in the vagina. Vaginal discharge found.  Genitourinary Comments: Pelvic exam: cervix pink, visually closed, moderate white thin discharge w/o odor present Bimanual: cervix closed, uterus non tender, no abnormalities palpated of adnexa.   Musculoskeletal: Normal range of motion. She exhibits no edema.  Neurological: She is alert and oriented to person, place, and time.  Psychiatric: She has a normal mood and affect. Her behavior is normal. Thought content normal.    MAU Course  Procedures  MDM Orders Placed This Encounter  Procedures  . Wet prep, genital  . US OB LESS THAN 14 WEEKS WITH OB TRANSVAGINAL  . Urinalysis, Routine w reflex microscopic  . CBC  . hCG, quantitative, pregnancy  . Pregnancy, urine POC  . ABO/Rh   Results for orders placed or performed during the hospital encounter of 05/24/18 (from the past 24 hour(s))  Urinalysis, Routine w reflex microscopic     Status: Abnormal   Collection Time: 05/24/18  5:28 PM  Result Value Ref Range   Color, Urine STRAW (A) YELLOW   APPearance CLEAR CLEAR   Specific Gravity, Urine 1.010 1.005 - 1.030   pH 8.0 5.0 - 8.0   Glucose, UA NEGATIVE NEGATIVE mg/dL   Hgb urine dipstick NEGATIVE NEGATIVE   Bilirubin Urine NEGATIVE NEGATIVE   Ketones, ur NEGATIVE NEGATIVE mg/dL   Protein, ur NEGATIVE NEGATIVE mg/dL   Nitrite NEGATIVE NEGATIVE   Leukocytes, UA TRACE (A) NEGATIVE   Bacteria, UA FEW (A) NONE SEEN   Squamous Epithelial / LPF 6-10 0 - 5  Pregnancy, urine POC     Status: Abnormal    Collection Time: 05/24/18  5:32 PM  Result Value Ref Range   Preg Test, Ur POSITIVE (A) NEGATIVE  Wet prep, genital     Status: Abnormal   Collection Time: 05/24/18  5:50 PM  Result Value Ref Range   Yeast Wet Prep HPF POC NONE SEEN NONE SEEN   Trich, Wet Prep NONE SEEN NONE SEEN   Clue Cells Wet Prep HPF POC NONE SEEN NONE SEEN   WBC, Wet Prep HPF POC FEW (A) NONE SEEN   Sperm NONE SEEN   CBC     Status: Abnormal   Collection Time: 05/24/18  6:16 PM  Result Value Ref Range  WBC 10.1 4.0 - 10.5 K/uL   RBC 3.73 (L) 3.87 - 5.11 MIL/uL   Hemoglobin 8.4 (L) 12.0 - 15.0 g/dL   HCT 40.9 (L) 81.1 - 91.4 %   MCV 75.6 (L) 80.0 - 100.0 fL   MCH 22.5 (L) 26.0 - 34.0 pg   MCHC 29.8 (L) 30.0 - 36.0 g/dL   RDW 78.2 (H) 95.6 - 21.3 %   Platelets 421 (H) 150 - 400 K/uL   nRBC 0.0 0.0 - 0.2 %  ABO/Rh     Status: None (Preliminary result)   Collection Time: 05/24/18  6:16 PM  Result Value Ref Range   ABO/RH(D)      O POS Performed at [facility], [address] [place], [place] [address]   hCG, quantitative, pregnancy     Status: Abnormal   Collection Time: 05/24/18  6:16 PM  Result Value Ref Range   hCG, Beta Chain, Quant, S 39,039 (H) <5 mIU/mL   US Ob Less Than 14 Weeks With Ob Transvaginal  Result Date: 05/24/2018 CLINICAL DATA:  Abdominal pain and cramping in 1st trimester pregnancy. EXAM: OBSTETRIC <14 WK [place] AND TRANSVAGINAL OB US TECHNIQUE: Both transabdominal and transvaginal ultrasound examinations were performed for complete evaluation of the gestation as well as the maternal uterus, adnexal regions, and pelvic cul-de-sac. Transvaginal technique was performed to assess early pregnancy. COMPARISON:  None. FINDINGS: Intrauterine gestational sac: Single Yolk sac:  Visualized. Embryo:  Visualized. Cardiac Activity: Visualized. Heart Rate: 103 bpm CRL:  4 mm   5 w   6 d                  [place] EDC: 01/18/2019 Subchorionic hemorrhage:  Small subchorionic hemorrhage. Maternal  uterus/adnexae: Retroverted uterus. Normal appearance of both ovaries. No mass or abnormal free fluid identified. IMPRESSION: Single living IUP measuring 5 weeks 6 days, with [place] EDC of 01/18/2019. Small subchorionic hemorrhage. Electronically Signed   By: Myles Rosenthal M.D.   On: 05/24/2018 19:47   Labs and [place] reviewed. Discussed results with patient. Educated on [facility] and what to expect with bleeding, discussed pelvic rest until resolution of [facility], patient verbalizes understanding. Discussed reasons to return to MAU. Educated on need to take iron supplementation 2x day until recheck of Hgb at new OB appointment. Pt stable at time of discharge.   Assessment and Plan   1. Normal IUP (intrauterine pregnancy) on prenatal ultrasound, first trimester   2. Abdominal pain during pregnancy in first trimester   3. [redacted] weeks gestation of pregnancy   4. Subchorionic hematoma in first trimester, single or unspecified fetus   5. Iron deficiency anemia, unspecified iron deficiency anemia type    Discharge home  Follow up as scheduled for prenatal appointment  Return to MAU as needed for reasons discussed or emergencies  Continue prenatal appointments  OTC Iron supplementation Pelvic rest and hydration   Allergies as of 05/24/2018   No Known Allergies     Medication List    STOP taking these medications   amoxicillin-clavulanate 875-125 MG tablet Commonly known as:  AUGMENTIN     TAKE these medications   albuterol 108 (90 Base) MCG/ACT inhaler Commonly known as:  PROVENTIL HFA;VENTOLIN HFA Inhale 2 puffs into the lungs every 6 (six) hours as needed for wheezing or shortness of breath.   citalopram 20 MG tablet Commonly known as:  CELEXA 1/2 tablet daily QHS x1wk, then 1 tablet po QHS   Prenatal Vitamins 28-0.8 MG Tabs Take 1 tablet  by mouth daily.   zolpidem 5 MG tablet Commonly known as:  AMBIEN Take 1 tablet (5 mg total) by mouth at bedtime as needed for sleep.      Sharyon Cable  CNM 05/24/2018, 8:14 PM

## 2018-05-25 LAB — GC/CHLAMYDIA PROBE AMP (~~LOC~~) NOT AT ARMC
Chlamydia: NEGATIVE
Neisseria Gonorrhea: NEGATIVE

## 2018-05-31 ENCOUNTER — Encounter (HOSPITAL_COMMUNITY): Payer: Self-pay | Admitting: Emergency Medicine

## 2018-05-31 ENCOUNTER — Inpatient Hospital Stay (HOSPITAL_COMMUNITY)
Admission: AD | Admit: 2018-05-31 | Discharge: 2018-05-31 | Disposition: A | Discharge status: Home / Self Care | Payer: 59 | Source: Ambulatory Visit | Attending: Obstetrics & Gynecology | Admitting: Obstetrics & Gynecology

## 2018-05-31 DIAGNOSIS — K5900 Constipation, unspecified: Secondary | ICD-10-CM

## 2018-05-31 DIAGNOSIS — O26891 Other specified pregnancy related conditions, first trimester: Secondary | ICD-10-CM | POA: Diagnosis not present

## 2018-05-31 DIAGNOSIS — Z3A01 Less than 8 weeks gestation of pregnancy: Secondary | ICD-10-CM | POA: Diagnosis not present

## 2018-05-31 DIAGNOSIS — O99611 Diseases of the digestive system complicating pregnancy, first trimester: Secondary | ICD-10-CM | POA: Diagnosis not present

## 2018-05-31 DIAGNOSIS — Z87891 Personal history of nicotine dependence: Secondary | ICD-10-CM | POA: Diagnosis not present

## 2018-05-31 DIAGNOSIS — R109 Unspecified abdominal pain: Secondary | ICD-10-CM

## 2018-05-31 LAB — URINALYSIS, ROUTINE W REFLEX MICROSCOPIC
BILIRUBIN URINE: NEGATIVE
Glucose, UA: NEGATIVE mg/dL
Hgb urine dipstick: NEGATIVE
Ketones, ur: NEGATIVE mg/dL
Leukocytes, UA: NEGATIVE
NITRITE: NEGATIVE
PH: 5 (ref 5.0–8.0)
Protein, ur: NEGATIVE mg/dL
Specific Gravity, Urine: 1.014 (ref 1.005–1.030)

## 2018-05-31 LAB — WET PREP, GENITAL
Clue Cells Wet Prep HPF POC: NONE SEEN
Sperm: NONE SEEN
TRICH WET PREP: NONE SEEN
Yeast Wet Prep HPF POC: NONE SEEN

## 2018-05-31 NOTE — MAU Note (Signed)
Pt having lower abdominal cramping since Thursday, rating pain 6/10. Denies bleeding. Denies LOF

## 2018-05-31 NOTE — MAU Provider Note (Addendum)
History     CSN: [id number]  Arrival date and time: 05/31/18 2015   First Provider Initiated Contact with Patient 05/31/18 2059      Chief Complaint  Patient presents with  . Abdominal Cramping   G3P1010 @[redacted]w[redacted]d  here with cramping. Cramping started 3 days ago. Pain is bilateral in lower abdomen. Rates 6/10. Has not tried anything for it. Reports having hard BM today and constipation. No VB. Had some thick white discharge yesterday. No odor or itching.    OB History    Gravida  3   Para  1   Term  1   Preterm      AB  1   Living  0     SAB  1   TAB      Ectopic      Multiple      Live Births  1           Past Medical History:  Diagnosis Date  . Anemia    history of iron deficiency  . Asthma   . Smokeless tobacco use    vape    Past Surgical History:  Procedure Laterality Date  . NO PAST SURGERIES  05/2018    Family History  Problem Relation Age of Onset  . Cholelithiasis Mother   . Hypertension Mother   . Cancer Maternal Aunt        breast  . Heart disease Maternal Grandmother   . Drug abuse Maternal Grandmother   . Hypertension Other   . Hyperlipidemia Other   . Heart disease Daughter        hypoplastic left heart, double right ventricle, pulmonary stenosis, other congenital heart disease  . Cancer Paternal Grandfather        skin  . Stroke Neg Hx     Social History   Tobacco Use  . Smoking status: Former Smoker    Types: Cigarettes  . Smokeless tobacco: Never Used  . Tobacco comment: juul  Substance Use Topics  . Alcohol use: No  . Drug use: No    Allergies: No Known Allergies  No medications prior to admission.    Review of Systems  Constitutional: Negative for fever.  Gastrointestinal: Positive for abdominal pain.  Genitourinary: Positive for vaginal discharge. Negative for dysuria and vaginal bleeding.   Physical Exam   Blood pressure (!) 98/53, pulse 85, temperature 98 F (36.7 C), temperature source Oral, resp.  rate 17, height 5\' 7"  (1.702 m), weight 84.8 kg, last menstrual period 03/12/2018, SpO2 100 %, unknown if currently breastfeeding.  Physical Exam  Constitutional: She is oriented to person, place, and time. She appears well-developed and well-nourished. No distress.  HENT:  Head: Normocephalic and atraumatic.  Neck: Normal range of motion.  Cardiovascular: Normal rate.  Respiratory: Effort normal. No respiratory distress.  GI: Soft. She exhibits no distension and no mass. There is no tenderness. There is no rebound and no guarding.  Genitourinary:  Genitourinary Comments: VE: cervix closed/long  Musculoskeletal: Normal range of motion.  Neurological: She is alert and oriented to person, place, and time.  Skin: Skin is warm and dry.  Psychiatric: Her mood appears anxious.   Limited bedside US: CRL [redacted]w[redacted]d, +cardiac activity  Results for orders placed or performed during the hospital encounter of 05/31/18 (from the past 24 hour(s))  Urinalysis, Routine w reflex microscopic     Status: None   Collection Time: 05/31/18  9:12 PM  Result Value Ref Range   Color, Urine YELLOW  YELLOW   APPearance CLEAR CLEAR   Specific Gravity, Urine 1.014 1.005 - 1.030   pH 5.0 5.0 - 8.0   Glucose, UA NEGATIVE NEGATIVE mg/dL   Hgb urine dipstick NEGATIVE NEGATIVE   Bilirubin Urine NEGATIVE NEGATIVE   Ketones, ur NEGATIVE NEGATIVE mg/dL   Protein, ur NEGATIVE NEGATIVE mg/dL   Nitrite NEGATIVE NEGATIVE   Leukocytes, UA NEGATIVE NEGATIVE  Wet prep, genital     Status: Abnormal   Collection Time: 05/31/18  9:12 PM  Result Value Ref Range   Yeast Wet Prep HPF POC NONE SEEN NONE SEEN   Trich, Wet Prep NONE SEEN NONE SEEN   Clue Cells Wet Prep HPF POC NONE SEEN NONE SEEN   WBC, Wet Prep HPF POC FEW (A) NONE SEEN   Sperm NONE SEEN    MAU Course  Procedures  MDM Labs ordered and reviewed. Pt very anxious, reassured with today's findings. Stable for discharge home.  Assessment and Plan   1. [redacted] weeks  gestation of pregnancy   2. Constipation, unspecified constipation type    Discharge home Follow up at [facility] to start care SAB precautions Increase dietary fiber and water  Allergies as of 05/31/2018   No Known Allergies     Medication List    STOP taking these medications   citalopram 20 MG tablet Commonly known as:  CELEXA   zolpidem 5 MG tablet Commonly known as:  AMBIEN     TAKE these medications   albuterol 108 (90 Base) MCG/ACT inhaler Commonly known as:  PROVENTIL HFA;VENTOLIN HFA Inhale 2 puffs into the lungs every 6 (six) hours as needed for wheezing or shortness of breath.   Prenatal Vitamins 28-0.8 MG Tabs Take 1 tablet by mouth daily.      Donette Larry, CNM 05/31/2018, 10:11 PM

## 2018-05-31 NOTE — Discharge Instructions (Signed)
Constipation, Adult Constipation is when a person has fewer bowel movements in a week than normal, has difficulty having a bowel movement, or has stools that are dry, hard, or larger than normal. Constipation may be caused by an underlying condition. It may become worse with age if a person takes certain medicines and does not take in enough fluids. Follow these instructions at home: Eating and drinking   Eat foods that have a lot of fiber, such as fresh fruits and vegetables, whole grains, and beans.  Limit foods that are high in fat, low in fiber, or overly processed, such as french fries, hamburgers, cookies, candies, and soda.  Drink enough fluid to keep your urine clear or pale yellow. General instructions  Exercise regularly or as told by your health care provider.  Go to the restroom when you have the urge to go. Do not hold it in.  Take over-the-counter and prescription medicines only as told by your health care provider. These include any fiber supplements.  Practice pelvic floor retraining exercises, such as deep breathing while relaxing the lower abdomen and pelvic floor relaxation during bowel movements.  Watch your condition for any changes.  Keep all follow-up visits as told by your health care provider. This is important. Contact a health care provider if:  You have pain that gets worse.  You have a fever.  You do not have a bowel movement after 4 days.  You vomit.  You are not hungry.  You lose weight.  You are bleeding from the anus.  You have thin, pencil-like stools. Get help right away if:  You have a fever and your symptoms suddenly get worse.  You leak stool or have blood in your stool.  Your abdomen is bloated.  You have severe pain in your abdomen.  You feel dizzy or you faint. This information is not intended to replace advice given to you by your health care provider. Make sure you discuss any questions you have with your health care  provider. Document Released: 04/26/2004 Document Revised: 02/16/2016 Document Reviewed: 01/17/2016 Elsevier Interactive Patient Education  2018 [employer]   Abdominal Pain During Pregnancy Belly (abdominal) pain is common during pregnancy. Most of the time, it is not a serious problem. Other times, it can be a sign that something is wrong with the pregnancy. Always tell your doctor if you have belly pain. Follow these instructions at home: Monitor your belly pain for any changes. The following actions may help you feel better: Do not have sex (intercourse) or put anything in your vagina until you feel better. Rest until your pain stops. Drink clear fluids if you feel sick to your stomach (nauseous). Do not eat solid food until you feel better. Only take medicine as told by your doctor. Keep all doctor visits as told.  Get help right away if: You are bleeding, leaking fluid, or pieces of tissue come out of your vagina. You have more pain or cramping. You keep throwing up (vomiting). You have pain when you pee (urinate) or have blood in your pee. You have a fever. You do not feel your baby moving as much. You feel very weak or feel like passing out. You have trouble breathing, with or without belly pain. You have a very bad headache and belly pain. You have fluid leaking from your vagina and belly pain. You keep having watery poop (diarrhea). Your belly pain does not go away after resting, or the pain gets worse. This information is  not intended to replace advice given to you by your health care provider. Make sure you discuss any questions you have with your health care provider. Document Released: 07/17/2009 Document Revised: 03/06/2016 Document Reviewed: 02/25/2013 Elsevier Interactive Patient Education  [employer].

## 2018-06-09 ENCOUNTER — Telehealth: Payer: Self-pay | Admitting: Licensed Clinical Social Worker

## 2018-06-09 NOTE — Telephone Encounter (Signed)
Voicemail appt reminder

## 2018-06-11 ENCOUNTER — Encounter: Payer: 59 | Admitting: Obstetrics and Gynecology

## 2018-06-11 ENCOUNTER — Encounter: Payer: Self-pay | Admitting: Obstetrics and Gynecology

## 2018-06-12 ENCOUNTER — Telehealth: Payer: Self-pay

## 2018-06-12 NOTE — Telephone Encounter (Signed)
Patient has appointment with [facility] on [address]

## 2018-07-28 ENCOUNTER — Inpatient Hospital Stay (HOSPITAL_COMMUNITY)
Admission: AD | Admit: 2018-07-28 | Discharge: 2018-07-28 | Disposition: A | Discharge status: Home / Self Care | Payer: 59 | Source: Ambulatory Visit | Attending: Family Medicine | Admitting: Family Medicine

## 2018-07-28 ENCOUNTER — Encounter (HOSPITAL_COMMUNITY): Payer: Self-pay

## 2018-07-28 DIAGNOSIS — O2242 Hemorrhoids in pregnancy, second trimester: Secondary | ICD-10-CM | POA: Diagnosis not present

## 2018-07-28 DIAGNOSIS — O99612 Diseases of the digestive system complicating pregnancy, second trimester: Secondary | ICD-10-CM

## 2018-07-28 DIAGNOSIS — O26892 Other specified pregnancy related conditions, second trimester: Secondary | ICD-10-CM | POA: Insufficient documentation

## 2018-07-28 DIAGNOSIS — K5900 Constipation, unspecified: Secondary | ICD-10-CM

## 2018-07-28 DIAGNOSIS — Z87891 Personal history of nicotine dependence: Secondary | ICD-10-CM | POA: Diagnosis not present

## 2018-07-28 DIAGNOSIS — Z3A15 15 weeks gestation of pregnancy: Secondary | ICD-10-CM | POA: Diagnosis not present

## 2018-07-28 LAB — URINALYSIS, ROUTINE W REFLEX MICROSCOPIC
Bilirubin Urine: NEGATIVE
GLUCOSE, UA: NEGATIVE mg/dL
Ketones, ur: 20 mg/dL — AB
Nitrite: NEGATIVE
PH: 6 (ref 5.0–8.0)
PROTEIN: NEGATIVE mg/dL
Specific Gravity, Urine: 1.027 (ref 1.005–1.030)

## 2018-07-28 LAB — WET PREP, GENITAL
CLUE CELLS WET PREP: NONE SEEN
SPERM: NONE SEEN
Trich, Wet Prep: NONE SEEN
Yeast Wet Prep HPF POC: NONE SEEN

## 2018-07-28 MED ORDER — TUCKS 50 % EX PADS: 1.0000 | MEDICATED_PAD | Freq: Every day | CUTANEOUS | 0 refills | Status: AC | PRN

## 2018-07-28 MED ORDER — HYDROCORTISONE 1 % EX CREA: TOPICAL_CREAM | CUTANEOUS | 0 refills | Status: AC

## 2018-07-28 MED ORDER — POLYETHYLENE GLYCOL 3350 17 G PO PACK: 17.0000 g | PACK | Freq: Every day | ORAL | 0 refills | Status: AC

## 2018-07-28 NOTE — MAU Provider Note (Signed)
History     CSN: [id number]  Arrival date and time: 07/28/18 1621   First Provider Initiated Contact with Patient 07/28/18 1708      Chief Complaint  Patient presents with  . Constipation  . Abdominal Pain   HPI Kristin Sutton is a 17 y.o. G3P1010 at [redacted]w[redacted]d who presents to MAU with complaints of constipation and abnormal vaginal discharge. She denies vaginal bleeding, leaking of fluid, fever, falls, or recent illness.    Constipation This is a new problem. Patient states she has not had a bowel movement in 9 days. Patient states she was recently put on a daily iron supplement and thinks this is the origin of her constipation. She enodrses drinking 5 12-oz bottles of water each day.  She became concerned when she felt abdominal cramping when straining to defecate. She is taking colace.  Vaginal discharge This is a new problem, onset within the past few days. Patient endorses greenish-wellow discharge. Not itchy, not foul smelling. She denies recent STI testing. She states she and FOB broke up for a period of time then reconciled just before becoming pregnant. She is unsure of his testing status.    OB History    Gravida  3   Para  1   Term  1   Preterm      AB  1   Living  0     SAB  1   TAB      Ectopic      Multiple      Live Births  1           Past Medical History:  Diagnosis Date  . Anemia    history of iron deficiency  . Asthma   . Smokeless tobacco use    vape    Past Surgical History:  Procedure Laterality Date  . NO PAST SURGERIES  05/2018    Family History  Problem Relation Age of Onset  . Cholelithiasis Mother   . Hypertension Mother   . Cancer Maternal Aunt        breast  . Heart disease Maternal Grandmother   . Drug abuse Maternal Grandmother   . Hypertension Other   . Hyperlipidemia Other   . Heart disease Daughter        hypoplastic left heart, double right ventricle, pulmonary stenosis, other congenital heart disease  .  Cancer Paternal Grandfather        skin  . Stroke Neg Hx     Social History   Tobacco Use  . Smoking status: Former Smoker    Types: Cigarettes  . Smokeless tobacco: Never Used  . Tobacco comment: juul  Substance Use Topics  . Alcohol use: No  . Drug use: No    Allergies: No Known Allergies  Medications Prior to Admission  Medication Sig Dispense Refill Last Dose  . albuterol (PROVENTIL HFA;VENTOLIN HFA) 108 (90 Base) MCG/ACT inhaler Inhale 2 puffs into the lungs every 6 (six) hours as needed for wheezing or shortness of breath. 1 Inhaler 0   . Prenatal Vit-Fe Fumarate-FA (PRENATAL VITAMINS) 28-0.8 MG TABS Take 1 tablet by mouth daily. 30 tablet 11     Review of Systems  Constitutional: Negative for chills, fatigue and fever.  Gastrointestinal: Positive for abdominal pain and constipation.  Genitourinary: Positive for vaginal discharge. Negative for vaginal bleeding and vaginal pain.  Neurological: Negative for headaches.  All other systems reviewed and are negative.  Physical Exam   Blood pressure 123/60, pulse 84,  temperature 98.1 F (36.7 C), temperature source Oral, resp. rate 15, height 5\' 5"  (1.651 m), weight 84.4 kg, last menstrual period 03/12/2018, SpO2 100 %, unknown if currently breastfeeding.  Physical Exam  Nursing note and vitals reviewed. Constitutional: She is oriented to person, place, and time. She appears well-developed and well-nourished.  Cardiovascular: Normal rate.  GI: Soft. Bowel sounds are normal. She exhibits no distension. There is no abdominal tenderness. There is no rebound and no guarding.  Genitourinary:    Vaginal discharge present.     Genitourinary Comments: Yellow-white thick vaginal discharge noted on swab collection   Neurological: She is alert and oriented to person, place, and time.  Skin: Skin is warm and dry.  Psychiatric: She has a normal mood and affect. Her behavior is normal. Judgment and thought content normal.    MAU  Course/MDM   --Large bowel movement s/p soap suds enema --Uncomplicated hemorrhoid noted. Discussed OTC care  Patient Vitals for the past 24 hrs:  BP Temp Temp src Pulse Resp SpO2 Height Weight  07/28/18 1907 112/62 - - 84 - - - -  07/28/18 1643 123/60 98.1 F (36.7 C) Oral 84 15 100 % 5\' 5"  (1.651 m) 84.4 kg    Results for orders placed or performed during the hospital encounter of 07/28/18 (from the past 24 hour(s))  Urinalysis, Routine w reflex microscopic     Status: Abnormal   Collection Time: 07/28/18  4:52 PM  Result Value Ref Range   Color, Urine YELLOW YELLOW   APPearance CLEAR CLEAR   Specific Gravity, Urine 1.027 1.005 - 1.030   pH 6.0 5.0 - 8.0   Glucose, UA NEGATIVE NEGATIVE mg/dL   Hgb urine dipstick SMALL (A) NEGATIVE   Bilirubin Urine NEGATIVE NEGATIVE   Ketones, ur 20 (A) NEGATIVE mg/dL   Protein, ur NEGATIVE NEGATIVE mg/dL   Nitrite NEGATIVE NEGATIVE   Leukocytes, UA TRACE (A) NEGATIVE   RBC / HPF 0-5 0 - 5 RBC/hpf   WBC, UA 0-5 0 - 5 WBC/hpf   Bacteria, UA MANY (A) NONE SEEN   Squamous Epithelial / LPF 0-5 0 - 5   Mucus PRESENT   Wet prep, genital     Status: Abnormal   Collection Time: 07/28/18  5:21 PM  Result Value Ref Range   Yeast Wet Prep HPF POC NONE SEEN NONE SEEN   Trich, Wet Prep NONE SEEN NONE SEEN   Clue Cells Wet Prep HPF POC NONE SEEN NONE SEEN   WBC, Wet Prep HPF POC MANY (A) NONE SEEN   Sperm NONE SEEN     Meds ordered this encounter  Medications  . polyethylene glycol (MIRALAX) packet    Sig: Take 17 g by mouth daily.    Dispense:  14 each    Refill:  0    Order Specific Question:   Supervising Provider    Answer:   Reva BoresPRATT, TANYA S [[id number]]  . Witch Hazel (TUCKS) 50 % PADS    Sig: Apply 1 each topically daily as needed.    Dispense:  1 each    Refill:  0    Order Specific Question:   Supervising Provider    Answer:   Reva BoresPRATT, TANYA S [[id number]]  . hydrocortisone cream 1 %    Sig: Apply to affected area 2 times daily    Dispense:  15 g     Refill:  0    Order Specific Question:   Supervising Provider    Answer:  Reva Bores [[id number]]    Assessment and Plan  --17 y.o. G3P1010 at [redacted]w[redacted]d  --FHT 149 by Doppler --Large bowel movement after enema --Uncomplicated hemorrhoid, rx to pharmacy --Discharge home in stable condition  F/U: OB Appt 08/11/18  Calvert Cantor, CNM 07/28/2018, 7:25 PM

## 2018-07-28 NOTE — Discharge Instructions (Signed)

## 2018-07-28 NOTE — MAU Note (Signed)
Pt reports she has not had a BM in over a week, lower abd cramping, headache, dizziness.

## 2018-07-29 LAB — GC/CHLAMYDIA PROBE AMP (~~LOC~~) NOT AT ARMC
Chlamydia: NEGATIVE
Neisseria Gonorrhea: NEGATIVE

## 2018-07-30 LAB — CULTURE, OB URINE

## 2018-07-31 ENCOUNTER — Encounter (HOSPITAL_COMMUNITY): Payer: Self-pay

## 2018-07-31 ENCOUNTER — Inpatient Hospital Stay (HOSPITAL_COMMUNITY)
Admission: AD | Admit: 2018-07-31 | Discharge: 2018-07-31 | Disposition: A | Discharge status: Home / Self Care | Payer: 59 | Source: Ambulatory Visit | Attending: Obstetrics and Gynecology | Admitting: Obstetrics and Gynecology

## 2018-07-31 DIAGNOSIS — Z87891 Personal history of nicotine dependence: Secondary | ICD-10-CM | POA: Insufficient documentation

## 2018-07-31 DIAGNOSIS — Z8249 Family history of ischemic heart disease and other diseases of the circulatory system: Secondary | ICD-10-CM | POA: Diagnosis not present

## 2018-07-31 DIAGNOSIS — O26892 Other specified pregnancy related conditions, second trimester: Secondary | ICD-10-CM | POA: Diagnosis not present

## 2018-07-31 DIAGNOSIS — Z3A15 15 weeks gestation of pregnancy: Secondary | ICD-10-CM

## 2018-07-31 DIAGNOSIS — O99352 Diseases of the nervous system complicating pregnancy, second trimester: Secondary | ICD-10-CM | POA: Insufficient documentation

## 2018-07-31 DIAGNOSIS — G43809 Other migraine, not intractable, without status migrainosus: Secondary | ICD-10-CM

## 2018-07-31 DIAGNOSIS — G43909 Migraine, unspecified, not intractable, without status migrainosus: Secondary | ICD-10-CM | POA: Diagnosis not present

## 2018-07-31 DIAGNOSIS — O99512 Diseases of the respiratory system complicating pregnancy, second trimester: Secondary | ICD-10-CM | POA: Insufficient documentation

## 2018-07-31 DIAGNOSIS — R51 Headache: Secondary | ICD-10-CM | POA: Diagnosis present

## 2018-07-31 DIAGNOSIS — J45909 Unspecified asthma, uncomplicated: Secondary | ICD-10-CM | POA: Insufficient documentation

## 2018-07-31 LAB — CBC
HEMATOCRIT: 35.4 % — AB (ref 36.0–46.0)
Hemoglobin: 11.8 g/dL — ABNORMAL LOW (ref 12.0–15.0)
MCH: 27.4 pg (ref 26.0–34.0)
MCHC: 33.3 g/dL (ref 30.0–36.0)
MCV: 82.3 fL (ref 80.0–100.0)
NRBC: 0 % (ref 0.0–0.2)
Platelets: 232 10*3/uL (ref 150–400)
RBC: 4.3 MIL/uL (ref 3.87–5.11)
RDW: 17.6 % — ABNORMAL HIGH (ref 11.5–15.5)
WBC: 8.2 10*3/uL (ref 4.0–10.5)

## 2018-07-31 LAB — COMPREHENSIVE METABOLIC PANEL
ALT: 13 U/L (ref 0–44)
AST: 13 U/L — AB (ref 15–41)
Albumin: 3.4 g/dL — ABNORMAL LOW (ref 3.5–5.0)
Alkaline Phosphatase: 52 U/L (ref 38–126)
Anion gap: 7 (ref 5–15)
BILIRUBIN TOTAL: 0.3 mg/dL (ref 0.3–1.2)
BUN: 7 mg/dL (ref 6–20)
CALCIUM: 8.8 mg/dL — AB (ref 8.9–10.3)
CHLORIDE: 105 mmol/L (ref 98–111)
CO2: 24 mmol/L (ref 22–32)
CREATININE: 0.63 mg/dL (ref 0.44–1.00)
GFR calc non Af Amer: >60 mL/min (ref >60)
Glucose, Bld: 90 mg/dL (ref 70–99)
Potassium: 3.8 mmol/L (ref 3.5–5.1)
Sodium: 136 mmol/L (ref 135–145)
TOTAL PROTEIN: 6.3 g/dL — AB (ref 6.5–8.1)

## 2018-07-31 LAB — URINALYSIS, ROUTINE W REFLEX MICROSCOPIC
BILIRUBIN URINE: NEGATIVE
GLUCOSE, UA: NEGATIVE mg/dL
Hgb urine dipstick: NEGATIVE
KETONES UR: NEGATIVE mg/dL
Nitrite: NEGATIVE
PH: 7 (ref 5.0–8.0)
Protein, ur: NEGATIVE mg/dL
SPECIFIC GRAVITY, URINE: 1.014 (ref 1.005–1.030)

## 2018-07-31 MED ORDER — LACTATED RINGERS IV SOLN
INTRAVENOUS | Status: DC
  Administered 2018-07-31: 04:00:00 via INTRAVENOUS

## 2018-07-31 MED ORDER — BUTALBITAL-APAP-CAFFEINE 50-325-40 MG PO TABS
2.0000 | ORAL_TABLET | Freq: Once | ORAL | Status: AC
  Administered 2018-07-31: 2 via ORAL
  Filled 2018-07-31: qty 2

## 2018-07-31 MED ORDER — DEXAMETHASONE SODIUM PHOSPHATE 10 MG/ML IJ SOLN
10.0000 mg | Freq: Once | INTRAMUSCULAR | Status: AC
  Administered 2018-07-31: 10 mg via INTRAVENOUS
  Filled 2018-07-31: qty 1

## 2018-07-31 MED ORDER — BUTALBITAL-APAP-CAFFEINE 50-325-40 MG PO CAPS: 1.0000 | ORAL_CAPSULE | Freq: Four times a day (QID) | ORAL | 0 refills | Status: DC | PRN

## 2018-07-31 MED ORDER — METOCLOPRAMIDE HCL 5 MG/ML IJ SOLN
5.0000 mg | Freq: Once | INTRAMUSCULAR | Status: AC
  Administered 2018-07-31: 5 mg via INTRAVENOUS
  Filled 2018-07-31: qty 2

## 2018-07-31 MED ORDER — DIPHENHYDRAMINE HCL 50 MG/ML IJ SOLN
25.0000 mg | Freq: Once | INTRAMUSCULAR | Status: AC
  Administered 2018-07-31: 25 mg via INTRAVENOUS
  Filled 2018-07-31: qty 1

## 2018-07-31 NOTE — MAU Provider Note (Signed)
Chief Complaint:  Headache   First Provider Initiated Contact with Patient 07/31/18 0212     HPI: Kristin Sutton is a 17 y.o. G3P1010 at 5715w4dwho presents to maternity admissions reporting headache for 3--4 days.  Took ibuprofen without relief.  Has never had migraines.  Has visual changes, seeing starts.. She reports good fetal movement, denies LOF, vaginal bleeding, vaginal itching/burning, urinary symptoms, h/a, dizziness, n/v, diarrhea, constipation or fever/chills.   Headache   This is a new problem. The current episode started in the past 7 days. The problem occurs constantly. The problem has been unchanged. The pain is located in the bilateral and frontal region. The pain does not radiate. The quality of the pain is described as aching and dull. Associated symptoms include blurred vision. Pertinent negatives include no abdominal pain, back pain, nausea, photophobia, sore throat or vomiting. Nothing aggravates the symptoms. She has tried NSAIDs for the symptoms. The treatment provided no relief.   RN Note: Headaches for the past several days.  Tonight since 2000 has had a constant headache.  No hx of any migraines.  Took ibuprofen at 2000 without any relief.  Also seeing spots.    Past Medical History: Past Medical History:  Diagnosis Date  . Anemia    history of iron deficiency  . Asthma   . Smokeless tobacco use    vape    Past obstetric history: OB History  Gravida Para Term Preterm AB Living  3 1 1   1  0  SAB TAB Ectopic Multiple Live Births  1       1    # Outcome Date GA Lbr Len/2nd Weight Sex Delivery Anes PTL Lv  3 Current           2 Term 11/26/16 2343w1d  2608 g F Vag-Spont  Y DEC  1 SAB             Past Surgical History: Past Surgical History:  Procedure Laterality Date  . NO PAST SURGERIES  05/2018    Family History: Family History  Problem Relation Age of Onset  . Cholelithiasis Mother   . Hypertension Mother   . Cancer Maternal Aunt        breast  .  Heart disease Maternal Grandmother   . Drug abuse Maternal Grandmother   . Hypertension Other   . Hyperlipidemia Other   . Heart disease Daughter        hypoplastic left heart, double right ventricle, pulmonary stenosis, other congenital heart disease  . Cancer Paternal Grandfather        skin  . Stroke Neg Hx     Social History: Social History   Tobacco Use  . Smoking status: Former Smoker    Types: Cigarettes  . Smokeless tobacco: Never Used  . Tobacco comment: juul  Substance Use Topics  . Alcohol use: No  . Drug use: No    Allergies: No Known Allergies  Meds:  Medications Prior to Admission  Medication Sig Dispense Refill Last Dose  . albuterol (PROVENTIL HFA;VENTOLIN HFA) 108 (90 Base) MCG/ACT inhaler Inhale 2 puffs into the lungs every 6 (six) hours as needed for wheezing or shortness of breath. 1 Inhaler 0   . hydrocortisone cream 1 % Apply to affected area 2 times daily 15 g 0   . polyethylene glycol (MIRALAX) packet Take 17 g by mouth daily. 14 each 0   . Prenatal Vit-Fe Fumarate-FA (PRENATAL VITAMINS) 28-0.8 MG TABS Take 1 tablet by mouth daily. 30  tablet 11   . Witch Hazel (TUCKS) 50 % PADS Apply 1 each topically daily as needed. 1 each 0     I have reviewed patient's Past Medical Hx, Surgical Hx, Family Hx, Social Hx, medications and allergies.   ROS:  Review of Systems  HENT: Negative for sore throat.   Eyes: Positive for blurred vision. Negative for photophobia.  Gastrointestinal: Negative for abdominal pain, nausea and vomiting.  Musculoskeletal: Negative for back pain.  Neurological: Positive for headaches.   Other systems negative  Physical Exam   Patient Vitals for the past 24 hrs:  BP Temp Pulse Resp SpO2 Height Weight  07/31/18 0118 (!) 107/45 - 82 - - - -  07/31/18 0039 101/78 98.9 F (37.2 C) 85 17 99 % - -  07/31/18 0037 - - - - - 5\' 5"  (1.651 m) 84.5 kg   Constitutional: Well-developed, well-nourished female in no acute distress.   Cardiovascular: normal rate and rhythm Respiratory: normal effort, clear to auscultation bilaterally GI: Abd soft, non-tender, gravid appropriate for gestational age.   No rebound or guarding. MS: Extremities nontender, no edema, normal ROM Neurologic: Alert and oriented x 4.  GU: Neg CVAT.  PELVIC EXAM: deferred  FHT: 147  Labs: Results for orders placed or performed during the hospital encounter of 07/31/18 (from the past 24 hour(s))  Urinalysis, Routine w reflex microscopic     Status: Abnormal   Collection Time: 07/31/18 12:48 AM  Result Value Ref Range   Color, Urine YELLOW YELLOW   APPearance CLEAR CLEAR   Specific Gravity, Urine 1.014 1.005 - 1.030   pH 7.0 5.0 - 8.0   Glucose, UA NEGATIVE NEGATIVE mg/dL   Hgb urine dipstick NEGATIVE NEGATIVE   Bilirubin Urine NEGATIVE NEGATIVE   Ketones, ur NEGATIVE NEGATIVE mg/dL   Protein, ur NEGATIVE NEGATIVE mg/dL   Nitrite NEGATIVE NEGATIVE   Leukocytes, UA SMALL (A) NEGATIVE   RBC / HPF 0-5 0 - 5 RBC/hpf   WBC, UA 0-5 0 - 5 WBC/hpf   Bacteria, UA MANY (A) NONE SEEN   Squamous Epithelial / LPF 0-5 0 - 5   Mucus PRESENT   CBC     Status: Abnormal   Collection Time: 07/31/18  1:18 AM  Result Value Ref Range   WBC 8.2 4.0 - 10.5 K/uL   RBC 4.30 3.87 - 5.11 MIL/uL   Hemoglobin 11.8 (L) 12.0 - 15.0 g/dL   HCT 16.1 (L) 09.6 - 04.5 %   MCV 82.3 80.0 - 100.0 fL   MCH 27.4 26.0 - 34.0 pg   MCHC 33.3 30.0 - 36.0 g/dL   RDW 40.9 (H) 81.1 - 91.4 %   Platelets 232 150 - 400 K/uL   nRBC 0.0 0.0 - 0.2 %  Comprehensive metabolic panel     Status: Abnormal   Collection Time: 07/31/18  1:18 AM  Result Value Ref Range   Sodium 136 135 - 145 mmol/L   Potassium 3.8 3.5 - 5.1 mmol/L   Chloride 105 98 - 111 mmol/L   CO2 24 22 - 32 mmol/L   Glucose, Bld 90 70 - 99 mg/dL   BUN 7 6 - 20 mg/dL   Creatinine, Ser 7.82 0.44 - 1.00 mg/dL   Calcium 8.8 (L) 8.9 - 10.3 mg/dL   Total Protein 6.3 (L) 6.5 - 8.1 g/dL   Albumin 3.4 (L) 3.5 - 5.0 g/dL    AST 13 (L) 15 - 41 U/L   ALT 13 0 - 44 U/L  Alkaline Phosphatase 52 38 - 126 U/L   Total Bilirubin 0.3 0.3 - 1.2 mg/dL   GFR calc non Af Amer >60 >60 mL/min   GFR calc Af Amer >60 >60 mL/min   Anion gap 7 5 - 15   --/--/O POS Performed at [facility], [address] [place], [place] [address]  [phone](10/13 1816)  Imaging:  No results found.  MAU Course/MDM: I have ordered labs and reviewed results. They are all within normal limits Fioricet given with only sllight relief, from a "10" to "7"  It did stop her visual changes  Treatments in MAU included IV fluids with Reglan, Benadryl and Decadron This significantly improved her headache. .    Assessment: Single intrauteirne pregnancy at 4011w4d Migraine headache  Plan: Discharge home Rx Fioricet for migraines per request. Follow up in Office for prenatal visits and recheck Patient to followup with her MD in [place] Encouraged to return here or to other Urgent Care/ED if she develops worsening of symptoms, increase in pain, fever, or other concerning symptoms.   Pt stable at time of discharge.  Wynelle BourgeoisMarie Golden Emile CNM, MSN Certified Nurse-Midwife 07/31/2018 4:24 AM

## 2018-07-31 NOTE — MAU Note (Signed)
Headaches for the past several days.  Tonight since 2000 has had a constant headache.  No hx of any migraines.  Took ibuprofen at 2000 without any relief.  Also seeing spots.

## 2018-07-31 NOTE — Discharge Instructions (Signed)

## 2018-08-25 ENCOUNTER — Emergency Department (HOSPITAL_COMMUNITY): Payer: 59

## 2018-08-25 ENCOUNTER — Encounter (HOSPITAL_COMMUNITY): Payer: Self-pay | Admitting: Emergency Medicine

## 2018-08-25 ENCOUNTER — Emergency Department (HOSPITAL_COMMUNITY)
Admission: EM | Admit: 2018-08-25 | Discharge: 2018-08-26 | Disposition: A | Discharge status: Home / Self Care | Payer: 59 | Attending: Emergency Medicine | Admitting: Emergency Medicine

## 2018-08-25 DIAGNOSIS — R112 Nausea with vomiting, unspecified: Secondary | ICD-10-CM | POA: Diagnosis not present

## 2018-08-25 DIAGNOSIS — R1013 Epigastric pain: Secondary | ICD-10-CM | POA: Insufficient documentation

## 2018-08-25 DIAGNOSIS — Z79899 Other long term (current) drug therapy: Secondary | ICD-10-CM | POA: Insufficient documentation

## 2018-08-25 DIAGNOSIS — R1011 Right upper quadrant pain: Secondary | ICD-10-CM | POA: Diagnosis not present

## 2018-08-25 DIAGNOSIS — Z87891 Personal history of nicotine dependence: Secondary | ICD-10-CM | POA: Insufficient documentation

## 2018-08-25 DIAGNOSIS — R509 Fever, unspecified: Secondary | ICD-10-CM | POA: Diagnosis not present

## 2018-08-25 DIAGNOSIS — Z3A19 19 weeks gestation of pregnancy: Secondary | ICD-10-CM | POA: Insufficient documentation

## 2018-08-25 DIAGNOSIS — O9989 Other specified diseases and conditions complicating pregnancy, childbirth and the puerperium: Secondary | ICD-10-CM | POA: Diagnosis present

## 2018-08-25 LAB — COMPREHENSIVE METABOLIC PANEL
ALBUMIN: 3.3 g/dL — AB (ref 3.5–5.0)
ALK PHOS: 60 U/L (ref 38–126)
ALT: 15 U/L (ref 0–44)
AST: 15 U/L (ref 15–41)
Anion gap: 9 (ref 5–15)
BUN: 7 mg/dL (ref 6–20)
CALCIUM: 8.9 mg/dL (ref 8.9–10.3)
CO2: 22 mmol/L (ref 22–32)
CREATININE: 0.56 mg/dL (ref 0.44–1.00)
Chloride: 107 mmol/L (ref 98–111)
GFR calc non Af Amer: >60 mL/min (ref >60)
GLUCOSE: 85 mg/dL (ref 70–99)
Potassium: 3.7 mmol/L (ref 3.5–5.1)
SODIUM: 138 mmol/L (ref 135–145)
Total Bilirubin: 0.6 mg/dL (ref 0.3–1.2)
Total Protein: 6.7 g/dL (ref 6.5–8.1)

## 2018-08-25 LAB — CBC WITH DIFFERENTIAL/PLATELET
ABS IMMATURE GRANULOCYTES: 0.04 10*3/uL (ref 0.00–0.07)
BASOS ABS: 0 10*3/uL (ref 0.0–0.1)
Basophils Relative: 0 %
EOS PCT: 1 %
Eosinophils Absolute: 0.1 10*3/uL (ref 0.0–0.5)
HCT: 39.9 % (ref 36.0–46.0)
Hemoglobin: 12.8 g/dL (ref 12.0–15.0)
Immature Granulocytes: 0 %
Lymphocytes Relative: 9 %
Lymphs Abs: 1 10*3/uL (ref 0.7–4.0)
MCH: 28.1 pg (ref 26.0–34.0)
MCHC: 32.1 g/dL (ref 30.0–36.0)
MCV: 87.5 fL (ref 80.0–100.0)
MONO ABS: 0.6 10*3/uL (ref 0.1–1.0)
Monocytes Relative: 5 %
NEUTROS ABS: 10.1 10*3/uL — AB (ref 1.7–7.7)
NRBC: 0 % (ref 0.0–0.2)
Neutrophils Relative %: 85 %
Platelets: 256 10*3/uL (ref 150–400)
RBC: 4.56 MIL/uL (ref 3.87–5.11)
RDW: 14.9 % (ref 11.5–15.5)
WBC: 11.9 10*3/uL — AB (ref 4.0–10.5)

## 2018-08-25 LAB — URINALYSIS, ROUTINE W REFLEX MICROSCOPIC
Bilirubin Urine: NEGATIVE
Bilirubin Urine: NEGATIVE
Glucose, UA: NEGATIVE mg/dL
Glucose, UA: NEGATIVE mg/dL
KETONES UR: NEGATIVE mg/dL
KETONES UR: NEGATIVE mg/dL
Nitrite: NEGATIVE
Nitrite: NEGATIVE
PH: 7 (ref 5.0–8.0)
PROTEIN: NEGATIVE mg/dL
Protein, ur: NEGATIVE mg/dL
Specific Gravity, Urine: 1.014 (ref 1.005–1.030)
Specific Gravity, Urine: 1.006 (ref 1.005–1.030)
pH: 6 (ref 5.0–8.0)

## 2018-08-25 LAB — LIPASE, BLOOD: Lipase: 33 U/L (ref 11–51)

## 2018-08-25 MED ORDER — PROMETHAZINE HCL 25 MG/ML IJ SOLN: 6.2500 mg | Freq: Once | INTRAMUSCULAR | Status: DC

## 2018-08-25 MED ORDER — ACETAMINOPHEN 325 MG PO TABS
650.0000 mg | ORAL_TABLET | Freq: Once | ORAL | Status: AC
  Administered 2018-08-25: 650 mg via ORAL
  Filled 2018-08-25: qty 2

## 2018-08-25 MED ORDER — METOCLOPRAMIDE HCL 5 MG/ML IJ SOLN
10.0000 mg | Freq: Once | INTRAMUSCULAR | Status: AC
  Administered 2018-08-25: 10 mg via INTRAVENOUS
  Filled 2018-08-25: qty 2

## 2018-08-25 MED ORDER — MORPHINE SULFATE (PF) 4 MG/ML IV SOLN
4.0000 mg | Freq: Once | INTRAVENOUS | Status: AC
  Administered 2018-08-25: 4 mg via INTRAVENOUS
  Filled 2018-08-25: qty 1

## 2018-08-25 MED ORDER — DIATRIZOATE MEGLUMINE & SODIUM 66-10 % PO SOLN
ORAL | Status: AC
  Filled 2018-08-25: qty 30

## 2018-08-25 MED ORDER — SODIUM CHLORIDE 0.9 % IV BOLUS
1000.0000 mL | Freq: Once | INTRAVENOUS | Status: AC
  Administered 2018-08-25: 1000 mL via INTRAVENOUS

## 2018-08-25 MED ORDER — MORPHINE SULFATE (PF) 4 MG/ML IV SOLN: 4.0000 mg | Freq: Once | INTRAVENOUS | Status: DC

## 2018-08-25 MED ORDER — TAMSULOSIN HCL 0.4 MG PO CAPS
0.4000 mg | ORAL_CAPSULE | Freq: Once | ORAL | Status: AC
  Administered 2018-08-25: 0.4 mg via ORAL
  Filled 2018-08-25: qty 1

## 2018-08-25 MED ORDER — IOPAMIDOL (ISOVUE-370) INJECTION 76%
INTRAVENOUS | Status: AC
  Administered 2018-08-25: 100 mL
  Filled 2018-08-25: qty 100

## 2018-08-25 MED ORDER — MORPHINE SULFATE (PF) 2 MG/ML IV SOLN
1.0000 mg | Freq: Once | INTRAVENOUS | Status: AC
  Administered 2018-08-25: 1 mg via INTRAVENOUS
  Filled 2018-08-25: qty 1

## 2018-08-25 MED ORDER — OXYCODONE-ACETAMINOPHEN 5-325 MG PO TABS
1.0000 | ORAL_TABLET | Freq: Once | ORAL | Status: AC
  Administered 2018-08-25: 1 via ORAL
  Filled 2018-08-25: qty 1

## 2018-08-25 MED ORDER — ALUM & MAG HYDROXIDE-SIMETH 200-200-20 MG/5ML PO SUSP
15.0000 mL | Freq: Once | ORAL | Status: AC
  Administered 2018-08-25: 15 mL via ORAL
  Filled 2018-08-25: qty 30

## 2018-08-25 MED ORDER — FAMOTIDINE IN NACL 20-0.9 MG/50ML-% IV SOLN
20.0000 mg | Freq: Once | INTRAVENOUS | Status: AC
  Administered 2018-08-25: 20 mg via INTRAVENOUS
  Filled 2018-08-25: qty 50

## 2018-08-25 NOTE — ED Notes (Signed)
Patient transported to US 

## 2018-08-25 NOTE — ED Notes (Signed)
Provider at bedside

## 2018-08-25 NOTE — ED Notes (Signed)
Patient transported to MRI 

## 2018-08-25 NOTE — ED Notes (Signed)
BIB EMS from home. Pt reports epigastric pain after vomiting. Pt is approx [redacted] weeks pregnant, took 4 Zofran PTA.

## 2018-08-25 NOTE — ED Notes (Signed)
Pt given water and ginger ale.

## 2018-08-25 NOTE — ED Provider Notes (Signed)
Physical Exam  BP 95/62 (BP Location: Left Arm)   Pulse 94   Temp 97.7 F (36.5 C) (Oral)   Resp 18   Ht 5\' 5"  (1.651 m)   Wt 81.6 kg   LMP 03/12/2018 (Approximate)   SpO2 100%   BMI 29.95 kg/m   Assumed care from Terance Hart, PA-C at 3:13 PM. Briefly, the patient is a 18 y.o. female with PMHx of  has a past medical history of Anemia, Asthma, and Smokeless tobacco use. here with colicky RUQ pain. Pt is currently 19 w pregnant. Patient has had recurrent pain here, difficult to control with p.o. and IV opioid pain medication.  Differential diagnosis including nephrolithiasis, biliary colic, appendicitis.  Case was discussed with Dr. Berneice Heinrich of urology who recommended Percocet, Flomax, renal ultrasound, since patient had noted right-sided hydronephrosis on her right upper quadrant ultrasound.  OB/GYN, Dr. Adrian Blackwater of faculty practice consulted who recommends obtaining imaging if the clinical picture is still unclear.  Patient currently awaiting MRI.  On assessment of the patient, she continues to have right upper quadrant pain greater than epigastric pain.  Reports it is colicky in nature.  She is also reported difficulty tolerating p.o.  Labs Reviewed  CBC WITH DIFFERENTIAL/PLATELET - Abnormal; Notable for the following components:      Result Value   WBC 11.9 (*)    Neutro Abs 10.1 (*)    All other components within normal limits  COMPREHENSIVE METABOLIC PANEL - Abnormal; Notable for the following components:   Albumin 3.3 (*)    All other components within normal limits  URINALYSIS, ROUTINE W REFLEX MICROSCOPIC - Abnormal; Notable for the following components:   APPearance CLOUDY (*)    Hgb urine dipstick SMALL (*)    Leukocytes, UA LARGE (*)    Bacteria, UA MANY (*)    All other components within normal limits  URINALYSIS, ROUTINE W REFLEX MICROSCOPIC - Abnormal; Notable for the following components:   Color, Urine STRAW (*)    Hgb urine dipstick SMALL (*)    Leukocytes, UA TRACE  (*)    Bacteria, UA FEW (*)    All other components within normal limits  URINE CULTURE  LIPASE, BLOOD    Course of Care:   Physical Exam Vitals signs and nursing note reviewed.  Constitutional:      General: She is not in acute distress.    Appearance: She is well-developed.  HENT:     Head: Normocephalic and atraumatic.  Eyes:     Conjunctiva/sclera: Conjunctivae normal.     Pupils: Pupils are equal, round, and reactive to light.  Neck:     Musculoskeletal: Normal range of motion and neck supple.  Cardiovascular:     Rate and Rhythm: Normal rate and regular rhythm.     Heart sounds: S1 normal and S2 normal. No murmur.  Pulmonary:     Effort: Pulmonary effort is normal.     Breath sounds: Normal breath sounds. No wheezing or rales.  Abdominal:     General: There is no distension.     Palpations: Abdomen is soft.     Tenderness: There is abdominal tenderness. There is no guarding.     Comments: Gravid uterus. Right upper quadrant to epigastric tenderness to palpation without guarding or rebound.  Musculoskeletal: Normal range of motion.        General: No deformity.  Lymphadenopathy:     Cervical: No cervical adenopathy.  Skin:    General: Skin is warm and dry.  Findings: No erythema or rash.  Neurological:     Mental Status: She is alert.     Comments: Cranial nerves grossly intact. Patient moves extremities symmetrically and with good coordination.  Psychiatric:        Behavior: Behavior normal.        Thought Content: Thought content normal.        Judgment: Judgment normal.     ED Course/Procedures   Clinical Course as of Aug 26 156  Tue Aug 25, 2018  1923 Improving.   BP(!): 103/53 [AM]  2236 Noting developing infiltrate in right lung base.   DG Chest 2 View [AM]  2306 Spoke with Dr. Rito Ehrlich of radiology who states that he would recommend that if there is a concern about esophageal perforation, patient should receive CT of chest with oral and IV  contrast.  Appreciate his involvement.  Will discuss with patient.   [AM]  2310 Engage in shared decision make with patient regarding obtaining CT of the chest with a 19-week pregnancy.  Patient was presented with both the option to follow-up closely as an outpatient on antibiotics versus obtaining CT.  Patient continues to be slightly tachycardic.  All risks, benefits, and alternatives were discussed of exposing patient to radiation, as well as discussed the procedure shielding the abdomen.   [AM]  Wed Aug 26, 2018  0128 Reassessed.  Patient is feeling better, tolerating p.o. and ambulating.  She reports she would like to go home.   [AM]  0128 Discussed with attending physician and does not recommend abx treatment.   Urinalysis, Routine w reflex microscopic(!) [AM]  0130 Pt on lying on her right side.   BP(!): 93/52 [AM]  0138 BP now 100/72 after having pt sit up and readjust BP cuff.    [AM]    Clinical Course User Index [AM] Elisha Ponder, PA-C    Procedures  MDM   Initial examination, patient is nontoxic-appearing, afebrile, in no acute distress, but uncomfortable on abdominal examination.  Differential diagnosis includes nephrolithiasis, cholecystitis, biliary colic, aspiration pneumonia, pulmonary embolism.  Case was again discussed with Dr. Adrian Blackwater of OB/GYN due to his familiarity with the case from earlier today as well as ability to see images of MRI.  Felt that patient's pain was not consistent with the physiologic hydronephrosis seen from the gravid uterus.  States that does not see any further causes of patient's pain that are emergent.  Appreciate his involvement.  After multiple reassessments of patient, clinical picture revealing no acute intra-abdominal cause.  Patient was continued to have pleuritic pain, reveals historical point that her pain seemed to occur after forceful vomiting this morning.  Question esophageal rupture.  Patient evaluated by attending physician,  Dr. Melene Plan.  Patient obtained additional CTPA with Gastrografin swallow assessing for any extravasation of contrast.  Showed no additional findings, no evidence of extravasation, no PE.  Also no pneumonia. Will not start antibiotics.   Patient had improvement in abdominal exam and tolerance of p.o. during emergency department course.  Patient did have a low-grade fever of 100.9.  Unclear etiology, however does not appear to be originating from an organ source given patient's multiple imaging today.  Question of patient may be developing viral infection.  Possibility of admission was discussed with the patient for overnight observation and fluids.  Patient stated that she did not want to stay in the hospital and would like to go home.  Given the multiple reassuring studies today and improvement in her  clinical status, I do feel that this is reasonable.  Patient was given small prescription for Reglan as needed for nausea and vomiting.  I instructed to patient that she needs strict follow-up in 2 days for repeat examination.  If she has any interim changes in her abdominal pain, nausea or vomiting, or any new or worsening symptoms, she needs immediate return to the emergency department.  This is a shared visit with Dr. Melene Planan Floyd. Patient was independently evaluated by this attending physician. Attending physician consulted in evaluation and discharge management.    Elisha PonderMurray, Josemaria Brining B, PA-C 08/26/18 0207    Melene PlanFloyd, Dan, DO 08/27/18 [phone]

## 2018-08-25 NOTE — ED Provider Notes (Signed)
[facility] EMERGENCY DEPARTMENT Provider Note   CSN: [id number] Arrival date & time: 08/25/18  1224     History   Chief Complaint Chief Complaint  Patient presents with  . Epigastric Pain    HPI Kristin Sutton is a 18 y.o.G24P1010 female who presents with abdominal pain.  She is [redacted] weeks pregnant.  She states that earlier this morning around 4 AM she had an episode of nausea and vomiting.  Afterwards she started having severe epigastric abdominal pain.  It is sharp and radiates to the right upper quadrant in the lower part of the chest.  She states she gets heartburn frequently and has recurrent nausea and vomiting with this pregnancy but it does not feel like heartburn.  She also has chronic constipation and this is unchanged. She could not go back to sleep therefore she came to the emergency department.  She denies any concerns with her pregnancy.  Her OB/GYN is at [facility].  She denies pelvic pain, vaginal bleeding or discharge.  She denies fever, current nausea, urinary symptoms.  She denies any prior abdominal surgeries.  HPI  Past Medical History:  Diagnosis Date  . Anemia    history of iron deficiency  . Asthma   . Smokeless tobacco use    vape    Patient Active Problem List   Diagnosis Date Noted  . Screen for STD (sexually transmitted disease) 05/12/2018  . Missed period 05/12/2018  . History of abnormal cervical Pap smear 05/12/2018  . Pelvic pain 05/12/2018  . Breast lump 05/12/2018  . Chronic constipation 05/12/2018  . Pregnancy test positive 05/12/2018  . Tobacco use 05/12/2018  . Depression, major, single episode, moderate (HCC) 05/12/2018  . Health supervision of infant or child 06/27/2011  . Paresthesia of left leg 06/27/2011  . Polydipsia 06/27/2011    Past Surgical History:  Procedure Laterality Date  . NO PAST SURGERIES  05/2018     OB History    Gravida  3   Para  1   Term  1   Preterm      AB  1   Living  0     SAB    1   TAB      Ectopic      Multiple      Live Births  1            Home Medications    Prior to Admission medications   Medication Sig Start Date End Date Taking? Authorizing Provider  albuterol (PROVENTIL HFA;VENTOLIN HFA) 108 (90 Base) MCG/ACT inhaler Inhale 2 puffs into the lungs every 6 (six) hours as needed for wheezing or shortness of breath. 05/12/18   Tysinger, Kermit Balo, PA-C  Butalbital-APAP-Caffeine 216 [id number] MG capsule Take 1-2 capsules by mouth every 6 (six) hours as needed for headache. 07/31/18   Aviva Signs, CNM  hydrocortisone cream 1 % Apply to affected area 2 times daily 07/28/18   Clayton Bibles C, CNM  polyethylene glycol [facility]) packet Take 17 g by mouth daily. 07/28/18   Calvert Cantor, CNM  Prenatal Vit-Fe Fumarate-FA (PRENATAL VITAMINS) 28-0.8 MG TABS Take 1 tablet by mouth daily. 05/12/18   Tysinger, Kermit Balo, PA-C  Witch Hazel (TUCKS) 50 % PADS Apply 1 each topically daily as needed. 07/28/18   Calvert Cantor, CNM    Family History Family History  Problem Relation Age of Onset  . Cholelithiasis Mother   . Hypertension Mother   . Cancer Maternal Aunt  breast  . Heart disease Maternal Grandmother   . Drug abuse Maternal Grandmother   . Hypertension Other   . Hyperlipidemia Other   . Heart disease Daughter        hypoplastic left heart, double right ventricle, pulmonary stenosis, other congenital heart disease  . Cancer Paternal Grandfather        skin  . Stroke Neg Hx     Social History Social History   Tobacco Use  . Smoking status: Former Smoker    Types: Cigarettes  . Smokeless tobacco: Never Used  . Tobacco comment: juul  Substance Use Topics  . Alcohol use: No  . Drug use: No     Allergies   Patient has no known allergies.   Review of Systems Review of Systems  Constitutional: Negative for chills and fever.  Respiratory: Negative for shortness of breath.   Cardiovascular: Negative for chest  pain.  Gastrointestinal: Positive for abdominal pain, constipation, nausea and vomiting. Negative for diarrhea.  Genitourinary: Negative for dysuria, frequency, pelvic pain, vaginal bleeding and vaginal discharge.  All other systems reviewed and are negative.    Physical Exam Updated Vital Signs BP 110/71   Pulse 87   Temp 98.4 F (36.9 C) (Oral)   Resp 18   Ht 5\' 5"  (1.651 m)   Wt 81.6 kg   LMP 03/12/2018 (Approximate)   SpO2 99%   BMI 29.95 kg/m   Physical Exam Vitals signs and nursing note reviewed.  Constitutional:      General: She is not in acute distress.    Appearance: Normal appearance. She is well-developed.     Comments: Calm and cooperative. NAD  HENT:     Head: Normocephalic and atraumatic.  Eyes:     General: No scleral icterus.       Right eye: No discharge.        Left eye: No discharge.     Conjunctiva/sclera: Conjunctivae normal.     Pupils: Pupils are equal, round, and reactive to light.  Neck:     Musculoskeletal: Normal range of motion.  Cardiovascular:     Rate and Rhythm: Normal rate and regular rhythm.  Pulmonary:     Effort: Pulmonary effort is normal. No respiratory distress.     Breath sounds: Normal breath sounds.  Abdominal:     General: There is no distension.     Palpations: Abdomen is soft.     Tenderness: There is abdominal tenderness (very tender in epigastric and RUQ).     Comments: Gravid uterus  Skin:    General: Skin is warm and dry.  Neurological:     Mental Status: She is alert and oriented to person, place, and time.  Psychiatric:        Behavior: Behavior normal.      ED Treatments / Results  Labs (all labs ordered are listed, but only abnormal results are displayed) Labs Reviewed  CBC WITH DIFFERENTIAL/PLATELET - Abnormal; Notable for the following components:      Result Value   WBC 11.9 (*)    Neutro Abs 10.1 (*)    All other components within normal limits  COMPREHENSIVE METABOLIC PANEL - Abnormal; Notable  for the following components:   Albumin 3.3 (*)    All other components within normal limits  URINALYSIS, ROUTINE W REFLEX MICROSCOPIC - Abnormal; Notable for the following components:   APPearance CLOUDY (*)    Hgb urine dipstick SMALL (*)    Leukocytes, UA LARGE (*)  Bacteria, UA MANY (*)    All other components within normal limits  URINALYSIS, ROUTINE W REFLEX MICROSCOPIC - Abnormal; Notable for the following components:   Color, Urine STRAW (*)    Hgb urine dipstick SMALL (*)    Leukocytes, UA TRACE (*)    Bacteria, UA FEW (*)    All other components within normal limits  URINE CULTURE  LIPASE, BLOOD    EKG None  Radiology [place] Renal  Result Date: 08/25/2018 CLINICAL DATA:  Right hydronephrosis demonstrated on right upper quadrant ultrasound dated 08/25/2018. EXAM: RENAL / URINARY TRACT ULTRASOUND COMPLETE COMPARISON:  Right upper quadrant ultrasound dated 08/25/2018 FINDINGS: Right Kidney: Renal measurements: 12.2 x 5.2 x 6.3 cm = volume: 210 mL . Echogenicity within normal limits. There is mild right hydronephrosis. No mass lesion. Right ureteral jet is identified in the bladder. Left Kidney: Renal measurements: 12.1 x 4.9 x 6.8 cm = volume: 211 mL. Echogenicity within normal limits. No mass or hydronephrosis visualized. Bladder: Appears normal for degree of bladder distention. Bilateral ureteral jets identified. IMPRESSION: 1. Mild right hydronephrosis, etiology unknown. The right ureter is patent as indicated by a right ureteral jet identified in the bladder. 2. Normal appearing left kidney. Electronically Signed   By: Francene BoyersJames  Maxwell M.D.   On: 08/25/2018 10:52   [place] Abdomen Limited Ruq  Result Date: 08/25/2018 CLINICAL DATA:  Right upper quadrant pain EXAM: ULTRASOUND ABDOMEN LIMITED RIGHT UPPER QUADRANT COMPARISON:  None. FINDINGS: Gallbladder: No gallstones or wall thickening visualized. No sonographic Murphy sign noted by sonographer. Common bile duct: Diameter: 4 mm.  Where  visualized, no filling defect. Liver: No focal lesion identified. Within normal limits in parenchymal echogenicity. Portal vein is patent on color Doppler imaging with normal direction of blood flow towards the liver. Mild right hydronephrosis. There is history of second trimester pregnancy. IMPRESSION: 1. Normal appearance of the liver and gallbladder. 2. Mild right hydronephrosis. Electronically Signed   By: Marnee SpringJonathon  Watts M.D.   On: 08/25/2018 08:36    Procedures Procedures (including critical care time)  Medications Ordered in ED Medications  famotidine (PEPCID) IVPB 20 mg premix (0 mg Intravenous Stopped 08/25/18 0840)  sodium chloride 0.9 % bolus 1,000 mL (0 mLs Intravenous Stopped 08/25/18 0908)  morphine 4 MG/ML injection 4 mg (4 mg Intravenous Given 08/25/18 0948)  oxyCODONE-acetaminophen (PERCOCET/ROXICET) 5-325 MG per tablet 1 tablet (1 tablet Oral Given 08/25/18 1255)  tamsulosin (FLOMAX) capsule 0.4 mg (0.4 mg Oral Given 08/25/18 1256)  metoCLOPramide (REGLAN) injection 10 mg (10 mg Intravenous Given 08/25/18 1400)     Initial Impression / Assessment and Plan / ED Course  I have reviewed the triage vital signs and the nursing notes.  Pertinent labs & imaging results that were available during my care of the patient were reviewed by me and considered in my medical decision making (see chart for details).  18 year old female presents with upper abdominal pain since this morning. Her vitals are normal. On exam, she is exquisitely tender in the RUQ. Will obtain labs, UA, get RUQ US and get pain control. The patient denies any pelvic complaints, vaginal bleeding, LOF and FHT measured was 145.   8:30 AM CBC is remarkable for mild leukocytosis (11.9). CMP is normal. UA shows small hgb, large leukocytes, many bacteria, 6-10 WBC but also appears contaminated. The patient denies any urinary symptoms. Will re-collect a clean specimen. RUQ US is remarkable for mild hydronephrosis. Will obtain  formal renal US.  11 AM Renal [place]  shows right sided mild hydronephrosis. There are visible jets seen. Repeat UA now shows trace leukocytes and few bacteria with 0-5 WBC. Discussed with Dr. Berneice Heinrich he recommends treating with Percocet and Flomax and f/u in office if pain can be controlled. Will attempted to get a hold of her OBGYN who is at [facility].  12:26 PM Pain is returning - will try Percocet since we will try to send her home with this. I was unable to talk to her OBGYN but called Dr. Adrian Blackwater at [facility] who recommends getting further imaging to get a diagnosis since pain has been somewhat intractable.   1:40PM RN reported the pain is still severe and she is now nauseous.   Discussed with attending. Will obtain MRI w contrast of abdomen to r/o kidney stone, pyelo, appendicitis.  Care signed out to [facility] who will dispo.    Final Clinical Impressions(s) / ED Diagnoses   Final diagnoses:  RUQ pain    ED Discharge Orders    None       Bethel Born, PA-C 08/25/18 1545    Alvira Monday, MD 08/27/18 [phone]

## 2018-08-25 NOTE — ED Notes (Addendum)
Pt provided with heating pads per pt request for one.

## 2018-08-25 NOTE — ED Notes (Addendum)
Fetal heart tone 145 bpm.

## 2018-08-25 NOTE — ED Notes (Signed)
Pt requesting pain medications and food, provider aware

## 2018-08-25 NOTE — ED Notes (Signed)
PA [facility] notified of blood pressures in the 90's. Pt mentating well. Will continue to monitor.

## 2018-08-26 DIAGNOSIS — O9989 Other specified diseases and conditions complicating pregnancy, childbirth and the puerperium: Secondary | ICD-10-CM | POA: Diagnosis not present

## 2018-08-26 LAB — URINE CULTURE: Culture: 50000 — AB

## 2018-08-26 MED ORDER — DIPHENHYDRAMINE HCL 50 MG/ML IJ SOLN
25.0000 mg | Freq: Once | INTRAMUSCULAR | Status: AC
  Administered 2018-08-26: 25 mg via INTRAVENOUS
  Filled 2018-08-26: qty 1

## 2018-08-26 MED ORDER — METOCLOPRAMIDE HCL 10 MG PO TABS: 10.0000 mg | ORAL_TABLET | Freq: Four times a day (QID) | ORAL | 0 refills | Status: DC

## 2018-08-26 MED ORDER — SODIUM CHLORIDE 0.9 % IV SOLN
Freq: Once | INTRAVENOUS | Status: AC
  Administered 2018-08-26: 01:00:00 via INTRAVENOUS

## 2018-08-26 MED ORDER — METOCLOPRAMIDE HCL 5 MG PO TABS: 5.0000 mg | ORAL_TABLET | Freq: Four times a day (QID) | ORAL | 0 refills | Status: AC

## 2018-08-26 MED ORDER — METOCLOPRAMIDE HCL 5 MG/ML IJ SOLN
5.0000 mg | Freq: Once | INTRAMUSCULAR | Status: AC
  Administered 2018-08-26: 5 mg via INTRAVENOUS
  Filled 2018-08-26: qty 2

## 2018-08-26 NOTE — ED Notes (Signed)
Pt ambulatory with steady gait. Denies dizziness. Also able to tolerate sips of ginger ale. Provider made aware

## 2018-08-26 NOTE — Discharge Instructions (Signed)
Please see the information and instructions below regarding your visit.  Your diagnoses today include:  1. RUQ pain   2. Epigastric pain   3. Non-intractable vomiting with nausea, unspecified vomiting type   4. Low grade fever     Your exam and testing today is reassuring that there is not a condition causing your abdominal pain that we immediately need to intervene on at this time.   Abdominal (belly) pain can be caused by many things. Your caregiver performed an examination and possibly ordered blood/urine tests and imaging (CT scan, x-rays, ultrasound). Many cases can be observed and treated at home after initial evaluation in the emergency department. Even though you are being discharged home, abdominal pain can be unpredictable. Therefore, you need a repeated exam if your pain does not resolve, returns, or worsens. Most patients with abdominal pain don't have to be admitted to the hospital or have surgery, but serious problems like appendicitis and gallbladder attacks can start out as nonspecific pain. Many abdominal conditions cannot be diagnosed in one visit, so follow-up evaluations are very important.  Tests performed today include: Blood counts and electrolytes Blood tests to check liver and kidney function Blood tests to check pancreas function Vital signs. See below for your results today.  MRI of abdomen and pelvis CT of chest to rule out blood clot or rupture of esophagus.   See side panel of your discharge paperwork for testing performed today. Vital signs are listed at the bottom of these instructions.   Medications prescribed:    Take any prescribed medications only as prescribed, and any over the counter medications only as directed on the packaging.  You may take Reglan, 5 mg every 6 hours as needed for nausea and vomiting.  You may take Tylenol, 650 mg every 6 hours as needed for pain.  Do not exceed 4000 mg in 1 day.  Home care instructions:  Try eating, but start  with foods that have a lot of fluid in them. Good examples are soup, Jell-O, and popsicles. If you do OK with those foods, you can try soft, bland foods, such as plain yogurt. Foods that are high in carbohydrates ("carbs"), like bread or saltine crackers, can help settle your stomach. Some people also find that ginger helps with nausea. You should avoid foods that have a lot of fat in them. They can make nausea worse. Call your doctor if your symptoms come back when you try to eat.  Please follow any educational materials contained in this packet.   Follow-up instructions: Please follow-up with your OBGYN in 2 days for recheck. Return sooner if you have worsening symptoms.   Return instructions:  Please return to the Emergency Department if you experience worsening symptoms.  SEEK IMMEDIATE MEDICAL ATTENTION IF: The pain does not go away or becomes severe  A temperature above 101F develops  Repeated vomiting occurs (multiple episodes)  The pain becomes localized to portions of the abdomen. The right side could possibly be appendicitis. In an adult, the left lower portion of the abdomen could be colitis or diverticulitis.  Blood is being passed in stools or vomit (bright red or black tarry stools)  You develop chest pain, difficulty breathing, dizziness or fainting, or become confused, poorly responsive, or inconsolable (young children) If you have any other emergent concerns regarding your health  Additional Information:   Your vital signs today were: BP (!) 93/52    Pulse 90    Temp (!) 100.9 F (38.3 C) (  Oral)    Resp 16    Ht 5\' 5"  (1.651 m)    Wt 81.6 kg    LMP 03/12/2018 (Approximate)    SpO2 98%    BMI 29.95 kg/m  If your blood pressure (BP) was elevated on multiple readings during this visit above 130 for the top number or above 80 for the bottom number, please have this repeated by your primary care provider within one month. --------------  Thank you for allowing us to  participate in your care today.

## 2018-08-27 ENCOUNTER — Telehealth: Payer: Self-pay

## 2018-08-27 NOTE — Telephone Encounter (Signed)
No treatment for UC from ED 08/26/2018 per Jodi Geralds [facility]

## 2018-11-11 ENCOUNTER — Inpatient Hospital Stay (HOSPITAL_COMMUNITY)
Admission: AD | Admit: 2018-11-11 | Discharge: 2018-11-11 | Disposition: A | Discharge status: Home / Self Care | Payer: 59 | Attending: Obstetrics & Gynecology | Admitting: Obstetrics & Gynecology

## 2018-11-11 ENCOUNTER — Other Ambulatory Visit: Payer: Self-pay

## 2018-11-11 ENCOUNTER — Encounter (HOSPITAL_COMMUNITY): Payer: Self-pay | Admitting: *Deleted

## 2018-11-11 DIAGNOSIS — Z3A30 30 weeks gestation of pregnancy: Secondary | ICD-10-CM | POA: Insufficient documentation

## 2018-11-11 DIAGNOSIS — O99513 Diseases of the respiratory system complicating pregnancy, third trimester: Secondary | ICD-10-CM | POA: Insufficient documentation

## 2018-11-11 DIAGNOSIS — R609 Edema, unspecified: Secondary | ICD-10-CM | POA: Diagnosis not present

## 2018-11-11 DIAGNOSIS — Z87891 Personal history of nicotine dependence: Secondary | ICD-10-CM | POA: Diagnosis not present

## 2018-11-11 DIAGNOSIS — J45909 Unspecified asthma, uncomplicated: Secondary | ICD-10-CM | POA: Diagnosis not present

## 2018-11-11 DIAGNOSIS — O26893 Other specified pregnancy related conditions, third trimester: Secondary | ICD-10-CM | POA: Insufficient documentation

## 2018-11-11 DIAGNOSIS — O99353 Diseases of the nervous system complicating pregnancy, third trimester: Secondary | ICD-10-CM | POA: Diagnosis not present

## 2018-11-11 DIAGNOSIS — R102 Pelvic and perineal pain: Secondary | ICD-10-CM | POA: Insufficient documentation

## 2018-11-11 DIAGNOSIS — G43809 Other migraine, not intractable, without status migrainosus: Secondary | ICD-10-CM | POA: Insufficient documentation

## 2018-11-11 LAB — URINALYSIS, ROUTINE W REFLEX MICROSCOPIC
Bilirubin Urine: NEGATIVE
Glucose, UA: NEGATIVE mg/dL
Hgb urine dipstick: NEGATIVE
Ketones, ur: NEGATIVE mg/dL
Nitrite: NEGATIVE
Protein, ur: NEGATIVE mg/dL
Specific Gravity, Urine: 1.017 (ref 1.005–1.030)
pH: 5 (ref 5.0–8.0)

## 2018-11-11 LAB — FETAL FIBRONECTIN: Fetal Fibronectin: NEGATIVE

## 2018-11-11 MED ORDER — BUTALBITAL-APAP-CAFFEINE 50-325-40 MG PO TABS
1.0000 | ORAL_TABLET | Freq: Once | ORAL | Status: AC
  Administered 2018-11-11: 22:00:00 1 via ORAL
  Filled 2018-11-11: qty 1

## 2018-11-11 MED ORDER — BUTALBITAL-APAP-CAFFEINE 50-325-40 MG PO CAPS: 1.0000 | ORAL_CAPSULE | Freq: Four times a day (QID) | ORAL | 0 refills | Status: AC | PRN

## 2018-11-11 NOTE — MAU Note (Signed)
Pt presents to MAU c/o vaginal pressure that started 3 days ago and it is getting worse. Pt reports some cramping in her lower abdomen. No bleeding or LOF. Pt reports a increase in vaginal discharge that is a greenish color pt reports she is getting over BV so she don't know if that has anything to do with it.  Pt reports a HA, swelling and seeing spots in her vision. No RUQ pain.

## 2018-11-11 NOTE — Discharge Instructions (Signed)
Abdominal Pain During Pregnancy  Abdominal pain is common during pregnancy, and has many possible causes. Some causes are more serious than others, and sometimes the cause is not known. Abdominal pain can be a sign that labor is starting. It can also be caused by normal growth and stretching of muscles and ligaments during pregnancy. Always tell your health care provider if you have any abdominal pain. Follow these instructions at home:  Do not have sex or put anything in your vagina until your pain goes away completely.  Get plenty of rest until your pain improves.  Drink enough fluid to keep your urine pale yellow.  Take over-the-counter and prescription medicines only as told by your health care provider.  Keep all follow-up visits as told by your health care provider. This is important. Contact a health care provider if:  Your pain continues or gets worse after resting.  You have lower abdominal pain that: ? Comes and goes at regular intervals. ? Spreads to your back. ? Is similar to menstrual cramps.  You have pain or burning when you urinate. Get help right away if:  You have a fever or chills.  You have vaginal bleeding.  You are leaking fluid from your vagina.  You are passing tissue from your vagina.  You have vomiting or diarrhea that lasts for more than 24 hours.  Your baby is moving less than usual.  You feel very weak or faint.  You have shortness of breath.  You develop severe pain in your upper abdomen. Summary  Abdominal pain is common during pregnancy, and has many possible causes.  If you experience abdominal pain during pregnancy, tell your health care provider right away.  Follow your health care provider's home care instructions and keep all follow-up visits as directed. This information is not intended to replace advice given to you by your health care provider. Make sure you discuss any questions you have with your health care  provider. Document Released: 07/29/2005 Document Revised: 10/31/2016 Document Reviewed: 10/31/2016 Elsevier Interactive Patient Education  2019 [employer]  Migraine Headache A migraine headache is an intense, throbbing pain on one side or both sides of the head. Migraines may also cause other symptoms, such as nausea, vomiting, and sensitivity to light and noise. What are the causes? Doing or taking certain things may also trigger migraines, such as:  Alcohol.  Smoking.  Medicines, such as: ? Medicine used to treat chest pain (nitroglycerine). ? Birth control pills. ? Estrogen pills. ? Certain blood pressure medicines.  Aged cheeses, chocolate, or caffeine.  Foods or drinks that contain nitrates, glutamate, aspartame, or tyramine.  Physical activity. Other things that may trigger a migraine include:  Menstruation.  Pregnancy.  Hunger.  Stress, lack of sleep, too much sleep, or fatigue.  Weather changes. What increases the risk? The following factors may make you more likely to experience migraine headaches:  Age. Risk increases with age.  Family history of migraine headaches.  Being Caucasian.  Depression and anxiety.  Obesity.  Being a woman.  Having a hole in the heart (patent foramen ovale) or other heart problems. What are the signs or symptoms? The main symptom of this condition is pulsating or throbbing pain. Pain may:  Happen in any area of the head, such as on one side or both sides.  Interfere with daily activities.  Get worse with physical activity.  Get worse with exposure to bright lights or loud noises. Other symptoms may include:  Nausea.  Vomiting.  Dizziness.  General sensitivity to bright lights, loud noises, or smells. Before you get a migraine, you may get warning signs that a migraine is developing (aura). An aura may include:  Seeing flashing lights or having blind spots.  Seeing bright spots, halos, or zigzag  lines.  Having tunnel vision or blurred vision.  Having numbness or a tingling feeling.  Having trouble talking.  Having muscle weakness. How is this diagnosed? A migraine headache can be diagnosed based on:  Your symptoms.  A physical exam.  Tests, such as CT scan or MRI of the head. These imaging tests can help rule out other causes of headaches.  Taking fluid from the spine (lumbar puncture) and analyzing it (cerebrospinal fluid analysis, or CSF analysis). How is this treated? A migraine headache is usually treated with medicines that:  Relieve pain.  Relieve nausea.  Prevent migraines from coming back. Treatment may also include:  Acupuncture.  Lifestyle changes like avoiding foods that trigger migraines. Follow these instructions at home: Medicines  Take over-the-counter and prescription medicines only as told by your health care provider.  Do not drive or use heavy machinery while taking prescription pain medicine.  To prevent or treat constipation while you are taking prescription pain medicine, your health care provider may recommend that you: ? Drink enough fluid to keep your urine clear or pale yellow. ? Take over-the-counter or prescription medicines. ? Eat foods that are high in fiber, such as fresh fruits and vegetables, whole grains, and beans. ? Limit foods that are high in fat and processed sugars, such as fried and sweet foods. Lifestyle  Avoid alcohol use.  Do not use any products that contain nicotine or tobacco, such as cigarettes and e-cigarettes. If you need help quitting, ask your health care provider.  Get at least 8 hours of sleep every night.  Limit your stress. General instructions      Keep a journal to find out what may trigger your migraine headaches. For example, write down: ? What you eat and drink. ? How much sleep you get. ? Any change to your diet or medicines.  If you have a migraine: ? Avoid things that make your  symptoms worse, such as bright lights. ? It may help to lie down in a dark, quiet room. ? Do not drive or use heavy machinery. ? Ask your health care provider what activities are safe for you while you are experiencing symptoms.  Keep all follow-up visits as told by your health care provider. This is important. Contact a health care provider if:  You develop symptoms that are different or more severe than your usual migraine symptoms. Get help right away if:  Your migraine becomes severe.  You have a fever.  You have a stiff neck.  You have vision loss.  Your muscles feel weak or like you cannot control them.  You start to lose your balance often.  You develop trouble walking.  You faint. This information is not intended to replace advice given to you by your health care provider. Make sure you discuss any questions you have with your health care provider. Document Released: 07/29/2005 Document Revised: 02/16/2016 Document Reviewed: 01/15/2016 Elsevier Interactive Patient Education  2019 [employer].

## 2018-11-11 NOTE — MAU Provider Note (Signed)
Chief Complaint:  Abdominal Pain and Pelvic Pain   First Provider Initiated Contact with Patient 11/11/18 2108      HPI: Kristin Sutton is a 18 y.o. G3P1010 at 93w3dwho presents to maternity admissions reporting pelvic pressure for 3 days.  States is worse today.  Also states she passed her mucous plug.  Also has a headache. She reports good fetal movement, denies LOF, vaginal bleeding, vaginal itching/burning, urinary symptoms, dizziness, n/v, diarrhea, constipation or fever/chills.  She denies headache, visual changes or RUQ abdominal pain.  RN Note: Pt presents to MAU c/o vaginal pressure that started 3 days ago and it is getting worse. Pt reports some cramping in her lower abdomen. No bleeding or LOF. Pt reports a increase in vaginal discharge that is a greenish color pt reports she is getting over BV so she don't know if that has anything to do with it.  Pt reports a HA, swelling and seeing spots in her vision. No RUQ pain.   Past Medical History: Past Medical History:  Diagnosis Date  . Anemia    history of iron deficiency  . Asthma   . Smokeless tobacco use    vape    Past obstetric history: OB History  Gravida Para Term Preterm AB Living  3 1 1   1  0  SAB TAB Ectopic Multiple Live Births  1       1    # Outcome Date GA Lbr Len/2nd Weight Sex Delivery Anes PTL Lv  3 Current           2 Term 11/26/16 [redacted]w[redacted]d  2608 g F Vag-Spont  Y DEC  1 SAB             Past Surgical History: Past Surgical History:  Procedure Laterality Date  . NO PAST SURGERIES  05/2018    Family History: Family History  Problem Relation Age of Onset  . Cholelithiasis Mother   . Hypertension Mother   . Cancer Maternal Aunt        breast  . Heart disease Maternal Grandmother   . Drug abuse Maternal Grandmother   . Hypertension Other   . Hyperlipidemia Other   . Heart disease Daughter        hypoplastic left heart, double right ventricle, pulmonary stenosis, other congenital heart disease  .  Cancer Paternal Grandfather        skin  . Stroke Neg Hx     Social History: Social History   Tobacco Use  . Smoking status: Former Smoker    Types: E-cigarettes  . Smokeless tobacco: Never Used  . Tobacco comment: juul  Substance Use Topics  . Alcohol use: No  . Drug use: No    Allergies: No Known Allergies  Meds:  No medications prior to admission.    I have reviewed patient's Past Medical Hx, Surgical Hx, Family Hx, Social Hx, medications and allergies.   ROS:  Review of Systems  Constitutional: Negative for chills and fever.  Eyes: Negative for visual disturbance.  Respiratory: Negative for shortness of breath.   Gastrointestinal: Positive for abdominal pain (pressure, not pain). Negative for constipation, diarrhea and nausea.  Genitourinary: Positive for vaginal discharge. Negative for vaginal bleeding.   Other systems negative  Physical Exam   Patient Vitals for the past 24 hrs:  BP Temp Temp src Pulse Resp Height Weight  11/11/18 2245 117/62 - - 87 - - -  11/11/18 2050 128/72 - - 94 - - -  11/11/18 2027  120/78 98 F (36.7 C) Oral (!) 101 19 5\' 6"  (1.676 m) 91.9 kg   Constitutional: Well-developed, well-nourished female in no acute distress.  Cardiovascular: normal rate and rhythm Respiratory: normal effort, clear to auscultation bilaterally GI: Abd soft, non-tender, gravid appropriate for gestational age.   No rebound or guarding. MS: Extremities nontender, no edema, normal ROM Neurologic: Alert and oriented x 4.  GU: Neg CVAT.  PELVIC EXAM:  Dilation: Fingertip Effacement (%): 50 Station: -1 Exam by:: Wynelle Bourgeois, CNM   FHT:  Baseline 140 , moderate variability, accelerations present, no decelerations Contractions:  Irregular and Rare, occasionally irritability with 20second cramps   Labs: Results for orders placed or performed during the hospital encounter of 11/11/18 (from the past 24 hour(s))  Urinalysis, Routine w reflex microscopic      Status: Abnormal   Collection Time: 11/11/18  9:01 PM  Result Value Ref Range   Color, Urine YELLOW YELLOW   APPearance HAZY (A) CLEAR   Specific Gravity, Urine 1.017 1.005 - 1.030   pH 5.0 5.0 - 8.0   Glucose, UA NEGATIVE NEGATIVE mg/dL   Hgb urine dipstick NEGATIVE NEGATIVE   Bilirubin Urine NEGATIVE NEGATIVE   Ketones, ur NEGATIVE NEGATIVE mg/dL   Protein, ur NEGATIVE NEGATIVE mg/dL   Nitrite NEGATIVE NEGATIVE   Leukocytes,Ua TRACE (A) NEGATIVE   RBC / HPF 0-5 0 - 5 RBC/hpf   WBC, UA 11-20 0 - 5 WBC/hpf   Bacteria, UA MANY (A) NONE SEEN   Squamous Epithelial / LPF 0-5 0 - 5   Mucus PRESENT    Ca Oxalate Crys, UA PRESENT   Fetal fibronectin     Status: None   Collection Time: 11/11/18  9:29 PM  Result Value Ref Range   Fetal Fibronectin NEGATIVE NEGATIVE   --/--/O POS Performed at [facility], [address], [place], [place] [address]  (10/13 1816)  Imaging:  No results found.  MAU Course/MDM: I have ordered labs and reviewed results. Fetal fibronectin sent since she reports this pressure is new.  She is not sure if she has been feeling contractions.  NST reviewed and is reactive  Discussed negative FFn is reassuring and that it indicates a smaller possibiliy of early delivery.  However I stressed that she should be vigilant about contractions since she does show some effacement. This effacement could be from being multiparous or from prior contractions.   Fioricet given with good relief of headache   Discussed headache care.   Assessment: 1. Other migraine without status migrainosus, not intractable   2. Pelvic pain affecting pregnancy in third trimester, antepartum   3.     Negative fetal fibronectin  Plan: Discharge home Rx Fioricet for headaches at home Preterm Labor precautions and fetal kick counts Follow up in Office for prenatal visits and recheck of cervix  Patient to call her doctor and notify them of what we did tonight  Follow-up Information     Your doctor Follow up.          Encouraged to return here or to other Urgent Care/ED if she develops worsening of symptoms, increase in pain, fever, or other concerning symptoms.  Pt stable at time of discharge.  Wynelle Bourgeois CNM, MSN Certified Nurse-Midwife 11/12/2018 1:22 AM

## 2018-11-11 NOTE — MAU Note (Signed)
Urine in lab 

## 2018-11-15 ENCOUNTER — Other Ambulatory Visit: Payer: Self-pay

## 2018-11-15 ENCOUNTER — Encounter (HOSPITAL_COMMUNITY): Payer: Self-pay

## 2018-11-15 ENCOUNTER — Inpatient Hospital Stay (HOSPITAL_COMMUNITY)
Admission: AD | Admit: 2018-11-15 | Discharge: 2018-11-15 | Disposition: A | Discharge status: Home / Self Care | Payer: 59 | Attending: Obstetrics & Gynecology | Admitting: Obstetrics & Gynecology

## 2018-11-15 DIAGNOSIS — K5900 Constipation, unspecified: Secondary | ICD-10-CM | POA: Diagnosis not present

## 2018-11-15 DIAGNOSIS — Z87891 Personal history of nicotine dependence: Secondary | ICD-10-CM | POA: Diagnosis not present

## 2018-11-15 DIAGNOSIS — Z3A30 30 weeks gestation of pregnancy: Secondary | ICD-10-CM | POA: Diagnosis not present

## 2018-11-15 DIAGNOSIS — O99613 Diseases of the digestive system complicating pregnancy, third trimester: Secondary | ICD-10-CM | POA: Insufficient documentation

## 2018-11-15 DIAGNOSIS — O9989 Other specified diseases and conditions complicating pregnancy, childbirth and the puerperium: Secondary | ICD-10-CM | POA: Diagnosis not present

## 2018-11-15 DIAGNOSIS — R109 Unspecified abdominal pain: Secondary | ICD-10-CM | POA: Diagnosis not present

## 2018-11-15 DIAGNOSIS — J45909 Unspecified asthma, uncomplicated: Secondary | ICD-10-CM | POA: Diagnosis not present

## 2018-11-15 DIAGNOSIS — O4703 False labor before 37 completed weeks of gestation, third trimester: Secondary | ICD-10-CM | POA: Diagnosis not present

## 2018-11-15 DIAGNOSIS — O26893 Other specified pregnancy related conditions, third trimester: Secondary | ICD-10-CM | POA: Insufficient documentation

## 2018-11-15 DIAGNOSIS — Z7982 Long term (current) use of aspirin: Secondary | ICD-10-CM | POA: Insufficient documentation

## 2018-11-15 DIAGNOSIS — R197 Diarrhea, unspecified: Secondary | ICD-10-CM | POA: Diagnosis not present

## 2018-11-15 DIAGNOSIS — O99513 Diseases of the respiratory system complicating pregnancy, third trimester: Secondary | ICD-10-CM | POA: Diagnosis not present

## 2018-11-15 DIAGNOSIS — Z79899 Other long term (current) drug therapy: Secondary | ICD-10-CM | POA: Diagnosis not present

## 2018-11-15 LAB — URINALYSIS, ROUTINE W REFLEX MICROSCOPIC
Bilirubin Urine: NEGATIVE
Glucose, UA: NEGATIVE mg/dL
Hgb urine dipstick: NEGATIVE
Ketones, ur: NEGATIVE mg/dL
Leukocytes,Ua: NEGATIVE
Nitrite: NEGATIVE
Protein, ur: NEGATIVE mg/dL
Specific Gravity, Urine: 1.013 (ref 1.005–1.030)
pH: 7 (ref 5.0–8.0)

## 2018-11-15 MED ORDER — LACTATED RINGERS IV BOLUS
1000.0000 mL | Freq: Once | INTRAVENOUS | Status: AC
  Administered 2018-11-15: 23:00:00 1000 mL via INTRAVENOUS

## 2018-11-15 NOTE — MAU Provider Note (Signed)
History     CSN: [id number]  Arrival date and time: 11/15/18 2132   First Provider Initiated Contact with Patient 11/15/18 2215      Chief Complaint  Patient presents with  . Contractions   HPI Kristin Sutton is a 18 y.o. G3P1010 at [redacted]w[redacted]d who presents with abdominal cramping. She states she took a laxative this morning for her constipation and has had diarrhea multiple times today. She states the cramping started after that. She states it is worse than it was when she was seen a few days ago in MAU. She denies any leaking or bleeding. Reports normal fetal movement. She gets prenatal care in [facility] and reports no problems in the pregnancy.  OB History    Gravida  3   Para  1   Term  1   Preterm      AB  1   Living  0     SAB  1   TAB      Ectopic      Multiple      Live Births  1           Past Medical History:  Diagnosis Date  . Anemia    history of iron deficiency  . Asthma   . Smokeless tobacco use    vape    Past Surgical History:  Procedure Laterality Date  . NO PAST SURGERIES  05/2018    Family History  Problem Relation Age of Onset  . Cholelithiasis Mother   . Hypertension Mother   . Cancer Maternal Aunt        breast  . Heart disease Maternal Grandmother   . Drug abuse Maternal Grandmother   . Hypertension Other   . Hyperlipidemia Other   . Heart disease Daughter        hypoplastic left heart, double right ventricle, pulmonary stenosis, other congenital heart disease  . Cancer Paternal Grandfather        skin  . Stroke Neg Hx     Social History   Tobacco Use  . Smoking status: Former Smoker    Types: E-cigarettes  . Smokeless tobacco: Never Used  . Tobacco comment: juul  Substance Use Topics  . Alcohol use: No  . Drug use: No    Allergies: No Known Allergies  Medications Prior to Admission  Medication Sig Dispense Refill Last Dose  . albuterol (PROVENTIL HFA;VENTOLIN HFA) 108 (90 Base) MCG/ACT inhaler Inhale 2  puffs into the lungs every 6 (six) hours as needed for wheezing or shortness of breath. 1 Inhaler 0   . aspirin EC 81 MG tablet Take 81 mg by mouth daily.   Past Week at Unknown time  . Butalbital-APAP-Caffeine 50-325-40 MG capsule Take 1 capsule by mouth every 6 (six) hours as needed for headache. 30 capsule 0   . ferrous sulfate 325 (65 FE) MG tablet Take 325 mg by mouth daily with breakfast.   11/10/2018 at Unknown time  . hydrocortisone cream 1 % Apply to affected area 2 times daily 15 g 0 Unknown at Unknown time  . metoCLOPramide (REGLAN) 5 MG tablet Take 1 tablet (5 mg total) by mouth every 6 (six) hours. 8 tablet 0 Unknown at Unknown time  . polyethylene glycol (MIRALAX) packet Take 17 g by mouth daily. 14 each 0 Unknown at Unknown time  . Prenatal Vit-Fe Fumarate-FA (PRENATAL VITAMINS) 28-0.8 MG TABS Take 1 tablet by mouth daily. 30 tablet 11 11/10/2018 at Unknown time  . Witch  Hazel (TUCKS) 50 % PADS Apply 1 each topically daily as needed. 1 each 0 Unknown at Unknown time    Review of Systems  Constitutional: Negative.  Negative for fatigue and fever.  HENT: Negative.   Respiratory: Negative.  Negative for shortness of breath.   Cardiovascular: Negative.  Negative for chest pain.  Gastrointestinal: Positive for abdominal pain. Negative for constipation, diarrhea, nausea and vomiting.  Genitourinary: Negative.  Negative for dysuria, vaginal bleeding and vaginal discharge.  Neurological: Negative.  Negative for dizziness and headaches.   Physical Exam   Blood pressure 126/80, pulse 96, temperature 98.1 F (36.7 C), resp. rate 18, last menstrual period 03/12/2018, SpO2 97 %, unknown if currently breastfeeding.  Physical Exam  Nursing note and vitals reviewed. Constitutional: She is oriented to person, place, and time. She appears well-developed and well-nourished. No distress.  HENT:  Head: Normocephalic.  Eyes: Pupils are equal, round, and reactive to light.  Cardiovascular:  Normal rate, regular rhythm and normal heart sounds.  Respiratory: Effort normal and breath sounds normal. No respiratory distress.  GI: Soft. Bowel sounds are normal. She exhibits no distension. There is no abdominal tenderness.  Neurological: She is alert and oriented to person, place, and time.  Skin: Skin is warm and dry.  Psychiatric: She has a normal mood and affect. Her behavior is normal. Judgment and thought content normal.   Dilation: Fingertip Effacement (%): Thick Exam by:: Cleone Slim CNM  Fetal Tracing:  Baseline: 130 Variability: moderate  Accels: 15x15 Decels: none  Toco: 3-8  MAU Course  Procedures Results for orders placed or performed during the hospital encounter of 11/15/18 (from the past 24 hour(s))  Urinalysis, Routine w reflex microscopic     Status: None   Collection Time: 11/15/18  9:49 PM  Result Value Ref Range   Color, Urine YELLOW YELLOW   APPearance CLEAR CLEAR   Specific Gravity, Urine 1.013 1.005 - 1.030   pH 7.0 5.0 - 8.0   Glucose, UA NEGATIVE NEGATIVE mg/dL   Hgb urine dipstick NEGATIVE NEGATIVE   Bilirubin Urine NEGATIVE NEGATIVE   Ketones, ur NEGATIVE NEGATIVE mg/dL   Protein, ur NEGATIVE NEGATIVE mg/dL   Nitrite NEGATIVE NEGATIVE   Leukocytes,Ua NEGATIVE NEGATIVE   MDM UA  Cervix unchanged from previous exam.  LR bolus  Contractions resolved after bolus and patient reports feeling better. Lengthy discussion with patient about constipation relief and stopping use of laxatives.   Assessment and Plan   1. Preterm uterine contractions in third trimester, antepartum   2. [redacted] weeks gestation of pregnancy    -Discharge home in stable condition -Abdominal pain precautions discussed -Patient advised to follow-up with OB as scheduled for prenatal care -Patient may return to MAU as needed or if her condition were to change or worsen   Rolm Bookbinder CNM 11/15/2018, 10:15 PM

## 2018-11-15 NOTE — MAU Note (Signed)
Pt here with complaints with "feeling dehydrated". States she has had diarrhea x3 days. Pt denies vomiting or fever. States she gets constipated and normally has a BM once a week. The diarrhea happens after constipation. Pt reports "tightening in stomach" that started a few hours ago. Feels the tightening every few minutes.

## 2018-11-15 NOTE — Discharge Instructions (Signed)

## 2018-11-26 ENCOUNTER — Other Ambulatory Visit: Payer: Self-pay

## 2018-11-26 ENCOUNTER — Inpatient Hospital Stay (HOSPITAL_COMMUNITY)
Admission: AD | Admit: 2018-11-26 | Discharge: 2018-11-26 | Disposition: A | Discharge status: Home / Self Care | Payer: 59 | Attending: Obstetrics and Gynecology | Admitting: Obstetrics and Gynecology

## 2018-11-26 ENCOUNTER — Encounter (HOSPITAL_COMMUNITY): Payer: Self-pay

## 2018-11-26 DIAGNOSIS — Z3A32 32 weeks gestation of pregnancy: Secondary | ICD-10-CM | POA: Diagnosis not present

## 2018-11-26 DIAGNOSIS — L439 Lichen planus, unspecified: Secondary | ICD-10-CM | POA: Diagnosis not present

## 2018-11-26 DIAGNOSIS — L739 Follicular disorder, unspecified: Secondary | ICD-10-CM

## 2018-11-26 DIAGNOSIS — O4703 False labor before 37 completed weeks of gestation, third trimester: Secondary | ICD-10-CM | POA: Diagnosis present

## 2018-11-26 DIAGNOSIS — O2343 Unspecified infection of urinary tract in pregnancy, third trimester: Secondary | ICD-10-CM | POA: Diagnosis not present

## 2018-11-26 LAB — URINALYSIS, ROUTINE W REFLEX MICROSCOPIC
Bilirubin Urine: NEGATIVE
Glucose, UA: NEGATIVE mg/dL
Hgb urine dipstick: NEGATIVE
Ketones, ur: NEGATIVE mg/dL
Nitrite: NEGATIVE
Protein, ur: NEGATIVE mg/dL
Specific Gravity, Urine: 1.019 (ref 1.005–1.030)
pH: 6 (ref 5.0–8.0)

## 2018-11-26 LAB — FETAL FIBRONECTIN: Fetal Fibronectin: NEGATIVE

## 2018-11-26 MED ORDER — CEPHALEXIN 500 MG PO CAPS: 500.0000 mg | ORAL_CAPSULE | Freq: Three times a day (TID) | ORAL | 0 refills | Status: AC

## 2018-11-26 MED ORDER — NIFEDIPINE 10 MG PO CAPS
10.0000 mg | ORAL_CAPSULE | ORAL | Status: AC | PRN
  Administered 2018-11-26 (×3): 10 mg via ORAL
  Filled 2018-11-26 (×3): qty 1

## 2018-11-26 NOTE — MAU Provider Note (Signed)
Chief Complaint:  Contractions   First Provider Initiated Contact with Patient 11/26/18 1718      HPI: Kristin Sutton is a 18 y.o. G3P1010 at [redacted]w[redacted]d by LMP and receives care at [facility] in [place] who presents to maternity admissions reporting a painful bump in her groin area and cramping in her abdomen. She reports the cramping started today and is irregular but frequent, sometimes 5, sometimes 8-10 times per hour.  She feels the cramping low in her abdomen. It does not radiate. There are no other associated symptoms. She noticed a sore spot in her right groin yesterday and today can feel a small lump there.  She recently shaved her pubic hair.  She has not tried any treatments.    HPI  Past Medical History: Past Medical History:  Diagnosis Date  . Anemia    history of iron deficiency  . Asthma   . Smokeless tobacco use    vape    Past obstetric history: OB History  Gravida Para Term Preterm AB Living  0  SAB TAB Ectopic Multiple Live Births  1       1    # Outcome Date GA Lbr Len/2nd Weight Sex Delivery Anes PTL Lv  3 Current           2 Term 11/26/16 [redacted]w[redacted]d  2608 g F Vag-Spont  Y DEC  1 SAB             Past Surgical History: Past Surgical History:  Procedure Laterality Date  . NO PAST SURGERIES  05/2018    Family History: Family History  Problem Relation Age of Onset  . Cholelithiasis Mother   . Hypertension Mother   . Cancer Maternal Aunt        breast  . Heart disease Maternal Grandmother   . Drug abuse Maternal Grandmother   . Hypertension Other   . Hyperlipidemia Other   . Heart disease Daughter        hypoplastic left heart, double right ventricle, pulmonary stenosis, other congenital heart disease  . Cancer Paternal Grandfather        skin  . Stroke Neg Hx     Social History: Social History   Tobacco Use  . Smoking status: Former Smoker    Types: E-cigarettes  . Smokeless tobacco: Never Used  . Tobacco comment: juul   Substance Use Topics  . Alcohol use: No  . Drug use: No    Allergies: No Known Allergies  Meds:  No medications prior to admission.    ROS:  Review of Systems  Constitutional: Negative for chills, fatigue and fever.  Eyes: Negative for visual disturbance.  Respiratory: Negative for shortness of breath.   Cardiovascular: Negative for chest pain.  Gastrointestinal: Positive for abdominal pain. Negative for nausea and vomiting.  Genitourinary: Negative for difficulty urinating, dysuria, flank pain, pelvic pain, vaginal bleeding, vaginal discharge and vaginal pain.  Skin:       Tender lump noted in right groin  Neurological: Negative for dizziness and headaches.  Psychiatric/Behavioral: Negative.      I have reviewed patient's Past Medical Hx, Surgical Hx, Family Hx, Social Hx, medications and allergies.   Physical Exam   Patient Vitals for the past 24 hrs:  BP Temp Temp src Pulse Resp SpO2 Weight  11/26/18 1947 96/63 - - (!) 102 - - -  11/26/18 1902 110/67 - - 96 - - -  11/26/18 1733 - - -  89 - - -  11/26/18 1649 128/82 98.1 F (36.7 C) Oral (!) 121 16 - 94 kg  11/26/18 1646 - - - - - 99 % -   Constitutional: Well-developed, well-nourished female in no acute distress.  Cardiovascular: normal rate Respiratory: normal effort GI: Abd soft, non-tender, gravid appropriate for gestational age.  MS: Extremities nontender, no edema, normal ROM Neurologic: Alert and oriented x 4.  GU: Neg CVAT.  PELVIC EXAM: On visual inspection, no enlargement or mass visible but small round tender mass palpable on inner thigh, inguinal area.  C/W folliculitis, no lymphadenopathy.  Dilation: Closed Effacement (%): Thick Cervical Position: Posterior Exam by:: Sharen Counter CNM  FHT:  Baseline 145, moderate variability, accelerations present, no decelerations Contractions: q  mins   Labs: Results for orders placed or performed during the hospital encounter of 11/26/18 (from the  past 24 hour(s))  Fetal fibronectin     Status: None   Collection Time: 11/26/18  5:36 PM  Result Value Ref Range   Fetal Fibronectin NEGATIVE NEGATIVE  Urinalysis, Routine w reflex microscopic     Status: Abnormal   Collection Time: 11/26/18  5:48 PM  Result Value Ref Range   Color, Urine YELLOW YELLOW   APPearance HAZY (A) CLEAR   Specific Gravity, Urine 1.019 1.005 - 1.030   pH 6.0 5.0 - 8.0   Glucose, UA NEGATIVE NEGATIVE mg/dL   Hgb urine dipstick NEGATIVE NEGATIVE   Bilirubin Urine NEGATIVE NEGATIVE   Ketones, ur NEGATIVE NEGATIVE mg/dL   Protein, ur NEGATIVE NEGATIVE mg/dL   Nitrite NEGATIVE NEGATIVE   Leukocytes,Ua MODERATE (A) NEGATIVE   RBC / HPF 0-5 0 - 5 RBC/hpf   WBC, UA 6-10 0 - 5 WBC/hpf   Bacteria, UA MANY (A) NONE SEEN   Squamous Epithelial / LPF 6-10 0 - 5   Mucus PRESENT    --/--/O POS Performed at [facility], [address] [place], [place] [address]  [phone])  Imaging:  No results found.  MAU Course/MDM: Orders Placed This Encounter  Procedures  . OB Urine Culture  . Urinalysis, Routine w reflex microscopic  . Fetal fibronectin  . Discharge patient    Meds ordered this encounter  Medications  . NIFEdipine (PROCARDIA) capsule 10 mg  . cephALEXin (KEFLEX) 500 MG capsule    Sig: Take 1 capsule (500 mg total) by mouth 3 (three) times daily.    Dispense:  21 capsule    Refill:  0    Order Specific Question:   Supervising Provider    Answer:   Alysia Penna, MICHAEL L [[id number]]     NST reviewed and reactive Contractions on toco, and palpable as mild every 2-3 minutes.  FFN negative and cervix closed so no evidence of preterm labor Procardia 10 mg x 3 doses given Q 20 minutes apart with complete resolution of pt pain and contractions on toco Moderate leukocytes on UA, culture sent. Will treat with Keflex 500 mg TID x 7 days. Pt to use warm compresses BID for folliculitis, reviewed reasons to seek care Preterm labor precautions reviewed  Pt  discharge with strict PTL precautions.    Assessment: 1. UTI (urinary tract infection) during pregnancy, third trimester   2. Threatened preterm labor, third trimester   3. Folliculitis     Plan: Discharge home Labor precautions and fetal kick counts Follow-up Information    Your prenatal providers at [facility] Follow up.   Why:  As scheduled, return to MAU or [facility]  with any emergencies         Allergies as of 11/26/2018   No Known Allergies     Medication List    TAKE these medications   albuterol 108 (90 Base) MCG/ACT inhaler Commonly known as:  VENTOLIN HFA Inhale 2 puffs into the lungs every 6 (six) hours as needed for wheezing or shortness of breath.   aspirin EC 81 MG tablet Take 81 mg by mouth daily.   Butalbital-APAP-Caffeine 50-325-40 MG capsule Take 1 capsule by mouth every 6 (six) hours as needed for headache.   cephALEXin 500 MG capsule Commonly known as:  KEFLEX Take 1 capsule (500 mg total) by mouth 3 (three) times daily.   ferrous sulfate 325 (65 FE) MG tablet Take 325 mg by mouth daily with breakfast.   hydrocortisone cream 1 % Apply to affected area 2 times daily   metoCLOPramide 5 MG tablet Commonly known as:  REGLAN Take 1 tablet (5 mg total) by mouth every 6 (six) hours.   polyethylene glycol 17 g packet Commonly known as:  MiraLax Take 17 g by mouth daily.   Prenatal Vitamins 28-0.8 MG Tabs Take 1 tablet by mouth daily.   Tucks 50 % Pads Apply 1 each topically daily as needed.       Sharen CounterLisa Leftwich-Kirby Certified Nurse-Midwife 11/26/2018 8:19 PM

## 2018-11-26 NOTE — MAU Note (Signed)
Pt was having some UC last night and again today, are irregular. Also wanted to check out a lump in her groin that she noticed last night. States her family has hx of cancer and wanted it checked out. As patient was sitting she said was having some spots or floaters in her vision and she thought she'd mention that while she was here.  Rates pain 5/10.

## 2018-11-29 LAB — CULTURE, OB URINE: Culture: NO GROWTH

## 2019-04-12 ENCOUNTER — Encounter (HOSPITAL_COMMUNITY): Payer: Self-pay

## 2019-05-19 ENCOUNTER — Other Ambulatory Visit: Payer: Self-pay

## 2019-05-19 ENCOUNTER — Emergency Department (HOSPITAL_COMMUNITY)
Admission: EM | Admit: 2019-05-19 | Discharge: 2019-05-19 | Discharge status: Against Medical Advice | Payer: Medicaid Other

## 2019-05-19 ENCOUNTER — Emergency Department (HOSPITAL_COMMUNITY): Payer: Medicaid Other

## 2019-05-19 ENCOUNTER — Encounter (HOSPITAL_COMMUNITY): Payer: Self-pay

## 2019-05-19 ENCOUNTER — Emergency Department (HOSPITAL_COMMUNITY)
Admission: EM | Admit: 2019-05-19 | Discharge: 2019-05-19 | Disposition: A | Discharge status: Home / Self Care | Payer: Medicaid Other | Attending: Emergency Medicine | Admitting: Emergency Medicine

## 2019-05-19 DIAGNOSIS — Z87891 Personal history of nicotine dependence: Secondary | ICD-10-CM | POA: Diagnosis not present

## 2019-05-19 DIAGNOSIS — N83202 Unspecified ovarian cyst, left side: Secondary | ICD-10-CM | POA: Diagnosis not present

## 2019-05-19 DIAGNOSIS — Z79899 Other long term (current) drug therapy: Secondary | ICD-10-CM | POA: Diagnosis not present

## 2019-05-19 DIAGNOSIS — Z7982 Long term (current) use of aspirin: Secondary | ICD-10-CM | POA: Insufficient documentation

## 2019-05-19 DIAGNOSIS — R102 Pelvic and perineal pain: Secondary | ICD-10-CM | POA: Diagnosis present

## 2019-05-19 LAB — WET PREP, GENITAL
Clue Cells Wet Prep HPF POC: NONE SEEN
Sperm: NONE SEEN
Trich, Wet Prep: NONE SEEN
Yeast Wet Prep HPF POC: NONE SEEN

## 2019-05-19 LAB — COMPREHENSIVE METABOLIC PANEL
ALT: 23 U/L (ref 0–44)
AST: 20 U/L (ref 15–41)
Albumin: 4.5 g/dL (ref 3.5–5.0)
Alkaline Phosphatase: 85 U/L (ref 38–126)
Anion gap: 9 (ref 5–15)
BUN: 11 mg/dL (ref 6–20)
CO2: 22 mmol/L (ref 22–32)
Calcium: 9.5 mg/dL (ref 8.9–10.3)
Chloride: 108 mmol/L (ref 98–111)
Creatinine, Ser: 0.75 mg/dL (ref 0.44–1.00)
GFR calc Af Amer: >60 mL/min (ref >60)
GFR calc non Af Amer: >60 mL/min (ref >60)
Glucose, Bld: 91 mg/dL (ref 70–99)
Potassium: 3.8 mmol/L (ref 3.5–5.1)
Sodium: 139 mmol/L (ref 135–145)
Total Bilirubin: 0.8 mg/dL (ref 0.3–1.2)
Total Protein: 7.5 g/dL (ref 6.5–8.1)

## 2019-05-19 LAB — URINALYSIS, ROUTINE W REFLEX MICROSCOPIC
Bilirubin Urine: NEGATIVE
Glucose, UA: NEGATIVE mg/dL
Ketones, ur: NEGATIVE mg/dL
Leukocytes,Ua: NEGATIVE
Nitrite: NEGATIVE
Protein, ur: 30 mg/dL — AB
RBC / HPF: >50 RBC/hpf — ABNORMAL HIGH (ref 0–5)
Specific Gravity, Urine: 1.023 (ref 1.005–1.030)
pH: 5 (ref 5.0–8.0)

## 2019-05-19 LAB — CBC
HCT: 41 % (ref 36.0–46.0)
Hemoglobin: 13.8 g/dL (ref 12.0–15.0)
MCH: 29.7 pg (ref 26.0–34.0)
MCHC: 33.7 g/dL (ref 30.0–36.0)
MCV: 88.2 fL (ref 80.0–100.0)
Platelets: 312 10*3/uL (ref 150–400)
RBC: 4.65 MIL/uL (ref 3.87–5.11)
RDW: 11.9 % (ref 11.5–15.5)
WBC: 7.9 10*3/uL (ref 4.0–10.5)
nRBC: 0 % (ref 0.0–0.2)

## 2019-05-19 LAB — I-STAT BETA HCG BLOOD, ED (MC, WL, AP ONLY): I-stat hCG, quantitative: <5 m[IU]/mL (ref <5)

## 2019-05-19 LAB — LIPASE, BLOOD: Lipase: 29 U/L (ref 11–51)

## 2019-05-19 MED ORDER — KETOROLAC TROMETHAMINE 15 MG/ML IJ SOLN
15.0000 mg | Freq: Once | INTRAMUSCULAR | Status: AC
  Administered 2019-05-19: 15 mg via INTRAVENOUS
  Filled 2019-05-19: qty 1

## 2019-05-19 MED ORDER — SODIUM CHLORIDE 0.9% FLUSH: 3.0000 mL | Freq: Once | INTRAVENOUS | Status: DC

## 2019-05-19 NOTE — ED Triage Notes (Signed)
Patient complains of irregular vaginal bleeding with lower abdominal pain x 3 days. States that she just had regular period 2 weeks ago. States that the pain is constant

## 2019-05-19 NOTE — ED Notes (Signed)
Patient transported to Ultrasound 

## 2019-05-19 NOTE — ED Provider Notes (Signed)
[facility]   CSN: [id number] Arrival date & time: 05/19/19  1647     History   Chief Complaint Chief Complaint  Patient presents with   Pelvic Pain   Vaginal Bleeding    HPI Kristin Sutton is a 18 y.o. female with a history of anemia, asthma, chronic constipation, and prior abnormal cervical Pap smear who presents to the emergency department with complaints of vaginal bleeding and intermittent pelvic pain for the past 3 days.  Patient states that 3 days prior she developed fairly quick onset pain to the left pelvic region that radiated to the entire suprapubic region. Pain is sharp, intermittent, occurs frequently, and does not have significant alleviating/aggravating factors. Pain is associated w/ vaginal bleeding, utilizing tampons with frequent changes. LMP 2 weeks prior. Gave birth vaginally 4 months ago, has been having normal periods since, no complications w/ pregnancy. Denies fever, chills, N/V, dysuria, frequency, urgency, vaginal discharge, or diarrhea. She also states she has a few bruises to her legs, her dog jumped on her, but states this is atypical for her.      HPI  Past Medical History:  Diagnosis Date   Anemia    history of iron deficiency   Asthma    Smokeless tobacco use    vape    Patient Active Problem List   Diagnosis Date Noted   Screen for STD (sexually transmitted disease) 05/12/2018   Missed period 05/12/2018   History of abnormal cervical Pap smear 05/12/2018   Pelvic pain 05/12/2018   Breast lump 05/12/2018   Chronic constipation 05/12/2018   Pregnancy test positive 05/12/2018   Tobacco use 05/12/2018   Depression, major, single episode, moderate (HCC) 05/12/2018   Health supervision of infant or child 06/27/2011   Paresthesia of left leg 06/27/2011   Polydipsia 06/27/2011    Past Surgical History:  Procedure Laterality Date   NO PAST SURGERIES  05/2018     OB  History    Gravida  3   Para  1   Term  1   Preterm      AB  1   Living  0     SAB  1   TAB      Ectopic      Multiple      Live Births  1            Home Medications    Prior to Admission medications   Medication Sig Start Date End Date Taking? Authorizing Provider  albuterol (PROVENTIL HFA;VENTOLIN HFA) 108 (90 Base) MCG/ACT inhaler Inhale 2 puffs into the lungs every 6 (six) hours as needed for wheezing or shortness of breath. 05/12/18   Tysinger, Kermit Baloavid S, PA-C  aspirin EC 81 MG tablet Take 81 mg by mouth daily.    [provider]  Butalbital-APAP-Caffeine [id number] MG capsule Take 1 capsule by mouth every 6 (six) hours as needed for headache. 11/11/18   Aviva SignsWilliams, Marie L, CNM  cephALEXin (KEFLEX) 500 MG capsule Take 1 capsule (500 mg total) by mouth 3 (three) times daily. 11/26/18   Leftwich-Kirby, Wilmer FloorLisa A, CNM  ferrous sulfate 325 (65 FE) MG tablet Take 325 mg by mouth daily with breakfast.    [provider]  hydrocortisone cream 1 % Apply to affected area 2 times daily 07/28/18   Calvert CantorWeinhold, Raelea Gosse C, CNM  metoCLOPramide (REGLAN) 5 MG tablet Take 1 tablet (5 mg total) by mouth every 6 (six) hours. 08/26/18   Dayton ScrapeMurray,  Alyssa B, PA-C  polyethylene glycol (MIRALAX) packet Take 17 g by mouth daily. 07/28/18   Calvert Cantor, CNM  Prenatal Vit-Fe Fumarate-FA (PRENATAL VITAMINS) 28-0.8 MG TABS Take 1 tablet by mouth daily. 05/12/18   Tysinger, Kermit Balo, PA-C  Witch Hazel (TUCKS) 50 % PADS Apply 1 each topically daily as needed. 07/28/18   Calvert Cantor, CNM    Family History Family History  Problem Relation Age of Onset   Cholelithiasis Mother    Hypertension Mother    Cancer Maternal Aunt        breast   Heart disease Maternal Grandmother    Drug abuse Maternal Grandmother    Hypertension Other    Hyperlipidemia Other    Heart disease Daughter        hypoplastic left heart, double right ventricle, pulmonary stenosis, other  congenital heart disease   Cancer Paternal Grandfather        skin   Stroke Neg Hx     Social History Social History   Tobacco Use   Smoking status: Former Smoker    Types: E-cigarettes   Smokeless tobacco: Never Used   Tobacco comment: juul  Substance Use Topics   Alcohol use: No   Drug use: No     Allergies   Patient has no known allergies.   Review of Systems Review of Systems  Constitutional: Negative for chills and fever.  Respiratory: Negative for shortness of breath.   Cardiovascular: Negative for chest pain.  Gastrointestinal: Negative for blood in stool, diarrhea, nausea and vomiting.  Genitourinary: Positive for pelvic pain and vaginal bleeding. Negative for dysuria and vaginal discharge.  Neurological: Negative for dizziness and syncope.  All other systems reviewed and are negative.    Physical Exam Updated Vital Signs BP 107/66 (BP Location: Right Arm)    Pulse (!) 111    Temp 98.2 F (36.8 C) (Oral)    Resp 18    SpO2 97%   Physical Exam Vitals signs and nursing note reviewed. Exam conducted with a chaperone present.  Constitutional:      General: She is not in acute distress.    Appearance: She is well-developed. She is not toxic-appearing.  HENT:     Head: Normocephalic and atraumatic.  Eyes:     General:        Right eye: No discharge.        Left eye: No discharge.     Conjunctiva/sclera: Conjunctivae normal.  Neck:     Musculoskeletal: Neck supple.  Cardiovascular:     Rate and Rhythm: Normal rate and regular rhythm.  Pulmonary:     Effort: Pulmonary effort is normal. No respiratory distress.     Breath sounds: Normal breath sounds. No wheezing, rhonchi or rales.  Abdominal:     General: There is no distension.     Palpations: Abdomen is soft.     Tenderness: There is abdominal tenderness in the suprapubic area. There is no guarding or rebound. Negative signs include Murphy's sign and McBurney's sign.  Genitourinary:    Labia:         Right: No lesion.        Left: No lesion.      Vagina: No vaginal discharge.     Cervix: No cervical motion tenderness, discharge or friability.     Adnexa:        Right: No mass or fullness.         Left: No mass or fullness.  Comments: Mild amount of blood preset in the vaginal vault.  Bilateral adnexal tenderness L > R Skin:    General: Skin is warm and dry.     Findings: No rash.     Comments: There are a few scattered bruises to the lower extremities.  No point/focal bony tenderness.  No petechiae/purpura  Neurological:     Mental Status: She is alert.     Comments: Clear speech.   Psychiatric:        Behavior: Behavior normal.    ED Treatments / Results  Labs (all labs ordered are listed, but only abnormal results are displayed) Labs Reviewed  URINALYSIS, ROUTINE W REFLEX MICROSCOPIC - Abnormal; Notable for the following components:      Result Value   APPearance HAZY (*)    Hgb urine dipstick LARGE (*)    Protein, ur 30 (*)    RBC / HPF >50 (*)    Bacteria, UA RARE (*)    All other components within normal limits  WET PREP, GENITAL  LIPASE, BLOOD  COMPREHENSIVE METABOLIC PANEL  CBC  I-STAT BETA HCG BLOOD, ED ([facility], [facility], [facility] ONLY)  GC/CHLAMYDIA PROBE AMP ([place]) NOT AT [facility]    EKG None  Radiology US Transvaginal Non-ob  Result Date: 05/19/2019 CLINICAL DATA:  Pelvic pain with irregular vaginal bleeding for the past 3 days. EXAM: TRANSABDOMINAL AND TRANSVAGINAL ULTRASOUND OF PELVIS DOPPLER ULTRASOUND OF OVARIES TECHNIQUE: Both transabdominal and transvaginal ultrasound examinations of the pelvis were performed. Transabdominal technique was performed for global imaging of the pelvis including uterus, ovaries, adnexal regions, and pelvic cul-de-sac. It was necessary to proceed with endovaginal exam following the transabdominal exam to visualize the uterus, endometrium, ovaries, and adnexa. Color and duplex Doppler ultrasound was utilized to evaluate  blood flow to the ovaries. COMPARISON:  MRI abdomen and pelvis dated August 25, 2018. OB ultrasound dated May 24, 2018. FINDINGS: Uterus Measurements: 8.0 x 3.8 x 4.7 cm = volume: 73 mL. No fibroids or other mass visualized. Endometrium Thickness: 4 mm.  No focal abnormality visualized. Right ovary Measurements: 2.4 x 7.3 x 1.4 cm = volume: 2 mL. Normal appearance/no adnexal mass. Left ovary Measurements: 3.6 x 2.6 x 2.9 cm = volume: 14 mL. 2.6 cm hemorrhagic cyst. Pulsed Doppler evaluation of both ovaries demonstrates normal low-resistance arterial and venous waveforms. Other findings Trace free fluid in the pelvis, likely physiologic. IMPRESSION: 1. 2.6 cm hemorrhagic cyst in the left ovary. No follow-up required. This recommendation follows the consensus statement: Management of Asymptomatic Ovarian and Other Adnexal Cysts Imaged at [place]: Society of Radiologists in [place]. Radiology 2010; [phone]. 2. Otherwise normal pelvic ultrasound. Electronically Signed   By: Titus Dubin M.D.   On: 05/19/2019 22:29   US Pelvis Complete  Result Date: 05/19/2019 CLINICAL DATA:  Pelvic pain with irregular vaginal bleeding for the past 3 days. EXAM: TRANSABDOMINAL AND TRANSVAGINAL ULTRASOUND OF PELVIS DOPPLER ULTRASOUND OF OVARIES TECHNIQUE: Both transabdominal and transvaginal ultrasound examinations of the pelvis were performed. Transabdominal technique was performed for global imaging of the pelvis including uterus, ovaries, adnexal regions, and pelvic cul-de-sac. It was necessary to proceed with endovaginal exam following the transabdominal exam to visualize the uterus, endometrium, ovaries, and adnexa. Color and duplex Doppler ultrasound was utilized to evaluate blood flow to the ovaries. COMPARISON:  MRI abdomen and pelvis dated August 25, 2018. OB ultrasound dated May 24, 2018. FINDINGS: Uterus Measurements: 8.0 x 3.8 x 4.7 cm = volume: 73 mL. No fibroids  or other mass  visualized. Endometrium Thickness: 4 mm.  No focal abnormality visualized. Right ovary Measurements: 2.4 x 7.3 x 1.4 cm = volume: 2 mL. Normal appearance/no adnexal mass. Left ovary Measurements: 3.6 x 2.6 x 2.9 cm = volume: 14 mL. 2.6 cm hemorrhagic cyst. Pulsed Doppler evaluation of both ovaries demonstrates normal low-resistance arterial and venous waveforms. Other findings Trace free fluid in the pelvis, likely physiologic. IMPRESSION: 1. 2.6 cm hemorrhagic cyst in the left ovary. No follow-up required. This recommendation follows the consensus statement: Management of Asymptomatic Ovarian and Other Adnexal Cysts Imaged at [place]: Society of Radiologists in Ultrasound Consensus Conference Statement. Radiology 2010; [phone]. 2. Otherwise normal pelvic ultrasound. Electronically Signed   By: Obie Dredge M.D.   On: 05/19/2019 22:29   [place] Art/ven Flow Abd Pelv Doppler  Result Date: 05/19/2019 CLINICAL DATA:  Pelvic pain with irregular vaginal bleeding for the past 3 days. EXAM: TRANSABDOMINAL AND TRANSVAGINAL ULTRASOUND OF PELVIS DOPPLER ULTRASOUND OF OVARIES TECHNIQUE: Both transabdominal and transvaginal ultrasound examinations of the pelvis were performed. Transabdominal technique was performed for global imaging of the pelvis including uterus, ovaries, adnexal regions, and pelvic cul-de-sac. It was necessary to proceed with endovaginal exam following the transabdominal exam to visualize the uterus, endometrium, ovaries, and adnexa. Color and duplex Doppler ultrasound was utilized to evaluate blood flow to the ovaries. COMPARISON:  MRI abdomen and pelvis dated August 25, 2018. OB ultrasound dated May 24, 2018. FINDINGS: Uterus Measurements: 8.0 x 3.8 x 4.7 cm = volume: 73 mL. No fibroids or other mass visualized. Endometrium Thickness: 4 mm.  No focal abnormality visualized. Right ovary Measurements: 2.4 x 7.3 x 1.4 cm = volume: 2 mL. Normal appearance/no adnexal mass. Left ovary Measurements: 3.6 x  2.6 x 2.9 cm = volume: 14 mL. 2.6 cm hemorrhagic cyst. Pulsed Doppler evaluation of both ovaries demonstrates normal low-resistance arterial and venous waveforms. Other findings Trace free fluid in the pelvis, likely physiologic. IMPRESSION: 1. 2.6 cm hemorrhagic cyst in the left ovary. No follow-up required. This recommendation follows the consensus statement: Management of Asymptomatic Ovarian and Other Adnexal Cysts Imaged at [place]: Society of Radiologists in Ultrasound Consensus Conference Statement. Radiology 2010; [phone]. 2. Otherwise normal pelvic ultrasound. Electronically Signed   By: Obie Dredge M.D.   On: 05/19/2019 22:29    Procedures Procedures (including critical care time)  Medications Ordered in ED Medications  sodium chloride flush (NS) 0.9 % injection 3 mL (3 mLs Intravenous Not Given 05/19/19 2135)  ketorolac (TORADOL) 15 MG/ML injection 15 mg (15 mg Intravenous Given 05/19/19 2137)     Initial Impression / Assessment and Plan / ED Course  I have reviewed the triage vital signs and the nursing notes.  Pertinent labs & imaging results that were available during my care of the patient were reviewed by me and considered in my medical decision making (see chart for details).   Patient presents to the emergency department with complaints of vaginal bleeding and left-sided pelvic pain intermittently for the past few days.  Patient nontoxic-appearing, no apparent distress, initial tachycardia normalized on my exam, otherwise vitals WNL.  Work-up reviewed: CBC: No leukocytosis, leukopenia, anemia, or thrombocytopenia CMP: Unremarkable. Renal function & LFTs WNL.  Lipase: WNL Preg test: Negative UA: Consistent w/ vaginal bleeding, no UTI.   Exam w/ suprapubic tenderness & bilateral adnexal tenderess (L>R). No peritoneal signs.  Preg test negative- doubt ectopic.  CBC/CMP/Lipase reassuring. No changes to raise concern regarding her bruising which is mild.  No UTI.  No  complaints of vaginal discharge, does not seem consistent w/ PID necessarily.  Will further assess w/ [place].   [place]: 2.6 cm hemorrhagic cyst in the left ovary--> suspect this is etiology of patient's pain. Given dose or toradol in the ED (currently breast feeding, discussed need to pump and discard breast milk, she often uses formula and will do so). Discussed use of tylenol and that she could use ibuprofen but not if she is breast feeding. Will discharge home with gyn follow up. I discussed results, treatment plan, need for follow-up, and return precautions with the patient. Provided opportunity for questions, patient confirmed understanding and is in agreement with plan.    Final Clinical Impressions(s) / ED Diagnoses   Final diagnoses:  Cyst of left ovary    ED Discharge Orders    None       Cherly Anderson, PA-C 05/19/19 2249    Lorre Nick, MD 05/20/19 [phone]

## 2019-05-21 LAB — GC/CHLAMYDIA PROBE AMP (~~LOC~~) NOT AT ARMC
Chlamydia: NEGATIVE
Neisseria Gonorrhea: NEGATIVE

## 2019-09-03 ENCOUNTER — Emergency Department (HOSPITAL_COMMUNITY)
Admission: EM | Admit: 2019-09-03 | Discharge: 2019-09-03 | Disposition: A | Discharge status: Home / Self Care | Payer: Medicaid Other | Attending: Emergency Medicine | Admitting: Emergency Medicine

## 2019-09-03 ENCOUNTER — Encounter (HOSPITAL_COMMUNITY): Payer: Self-pay | Admitting: Emergency Medicine

## 2019-09-03 ENCOUNTER — Other Ambulatory Visit: Payer: Self-pay

## 2019-09-03 DIAGNOSIS — Y929 Unspecified place or not applicable: Secondary | ICD-10-CM | POA: Insufficient documentation

## 2019-09-03 DIAGNOSIS — X58XXXA Exposure to other specified factors, initial encounter: Secondary | ICD-10-CM | POA: Insufficient documentation

## 2019-09-03 DIAGNOSIS — Y939 Activity, unspecified: Secondary | ICD-10-CM | POA: Diagnosis not present

## 2019-09-03 DIAGNOSIS — Y998 Other external cause status: Secondary | ICD-10-CM | POA: Diagnosis not present

## 2019-09-03 DIAGNOSIS — Z87891 Personal history of nicotine dependence: Secondary | ICD-10-CM | POA: Diagnosis not present

## 2019-09-03 DIAGNOSIS — Z20822 Contact with and (suspected) exposure to covid-19: Secondary | ICD-10-CM | POA: Diagnosis not present

## 2019-09-03 DIAGNOSIS — J45909 Unspecified asthma, uncomplicated: Secondary | ICD-10-CM | POA: Insufficient documentation

## 2019-09-03 DIAGNOSIS — Z7982 Long term (current) use of aspirin: Secondary | ICD-10-CM | POA: Diagnosis not present

## 2019-09-03 DIAGNOSIS — S3992XA Unspecified injury of lower back, initial encounter: Secondary | ICD-10-CM | POA: Diagnosis present

## 2019-09-03 DIAGNOSIS — Z79899 Other long term (current) drug therapy: Secondary | ICD-10-CM | POA: Insufficient documentation

## 2019-09-03 DIAGNOSIS — J111 Influenza due to unidentified influenza virus with other respiratory manifestations: Secondary | ICD-10-CM | POA: Diagnosis not present

## 2019-09-03 DIAGNOSIS — S39012A Strain of muscle, fascia and tendon of lower back, initial encounter: Secondary | ICD-10-CM | POA: Insufficient documentation

## 2019-09-03 LAB — URINALYSIS, ROUTINE W REFLEX MICROSCOPIC
Bilirubin Urine: NEGATIVE
Glucose, UA: NEGATIVE mg/dL
Ketones, ur: NEGATIVE mg/dL
Leukocytes,Ua: NEGATIVE
Nitrite: NEGATIVE
Protein, ur: NEGATIVE mg/dL
Specific Gravity, Urine: 1.009 (ref 1.005–1.030)
pH: 8 (ref 5.0–8.0)

## 2019-09-03 LAB — PREGNANCY, URINE: Preg Test, Ur: NEGATIVE

## 2019-09-03 LAB — RESPIRATORY PANEL BY RT PCR (FLU A&B, COVID)
Influenza A by PCR: NEGATIVE
Influenza B by PCR: NEGATIVE
SARS Coronavirus 2 by RT PCR: NEGATIVE

## 2019-09-03 LAB — POC SARS CORONAVIRUS 2 AG -  ED: SARS Coronavirus 2 Ag: NEGATIVE

## 2019-09-03 MED ORDER — ACETAMINOPHEN 325 MG PO TABS
650.0000 mg | ORAL_TABLET | Freq: Once | ORAL | Status: AC
  Administered 2019-09-03: 650 mg via ORAL
  Filled 2019-09-03: qty 2

## 2019-09-03 MED ORDER — SODIUM CHLORIDE 0.9 % IV BOLUS
1000.0000 mL | Freq: Once | INTRAVENOUS | Status: AC
  Administered 2019-09-03: 1000 mL via INTRAVENOUS

## 2019-09-03 MED ORDER — METHOCARBAMOL 500 MG PO TABS: 500.0000 mg | ORAL_TABLET | Freq: Three times a day (TID) | ORAL | 0 refills | Status: AC | PRN

## 2019-09-03 MED ORDER — METHOCARBAMOL 500 MG PO TABS
500.0000 mg | ORAL_TABLET | Freq: Once | ORAL | Status: AC
  Administered 2019-09-03: 17:00:00 500 mg via ORAL
  Filled 2019-09-03: qty 1

## 2019-09-03 MED ORDER — OXYCODONE-ACETAMINOPHEN 5-325 MG PO TABS
1.0000 | ORAL_TABLET | Freq: Once | ORAL | Status: AC
  Administered 2019-09-03: 1 via ORAL
  Filled 2019-09-03: qty 1

## 2019-09-03 NOTE — ED Notes (Signed)
Patient verbalizes understanding of discharge instructions. Opportunity for questioning and answers were provided.  pt discharged from ED, ambulatory by self   

## 2019-09-03 NOTE — Discharge Instructions (Addendum)
We will contact you with the results of your lab work when it is available. Continue Tylenol, ibuprofen and take the muscle relaxer as needed. Make sure you increase your oral hydration. Take the muscle relaxer after you breastfeed your child. Return to the ED if you start to develop worsening pain, inability to walk, chest pain, shortness of breath.

## 2019-09-03 NOTE — ED Provider Notes (Signed)
[facility] EMERGENCY DEPARTMENT Provider Note   CSN: [id number] Arrival date & time: 09/03/19  1514     History Chief Complaint  Patient presents with   Back Pain   Fever   Generalized Body Aches    Kristin Sutton is a 19 y.o. female with a past medical history of anemia, asthma, presenting to the ED for back pain, chills, generalized body aches since waking up this morning.  States that she has had constant back pain that has not improved with Tylenol.  She states that pain is worse when she tried to pick up her daughter.  Remains ambulatory.  Did note subjective fever and chills as well as generalized body aches and decreased appetite since this morning.  No known COVID-19 exposures or sick contacts with similar symptoms.  Denies any loss of bowel or bladder function, prior back surgeries, shortness of breath, chest pain, cough, leg swelling, changes to gait, numbness in arms or legs, urinary symptoms.  HPI     Past Medical History:  Diagnosis Date   Anemia    history of iron deficiency   Asthma    Smokeless tobacco use    vape    Patient Active Problem List   Diagnosis Date Noted   Screen for STD (sexually transmitted disease) 05/12/2018   Missed period 05/12/2018   History of abnormal cervical Pap smear 05/12/2018   Pelvic pain 05/12/2018   Breast lump 05/12/2018   Chronic constipation 05/12/2018   Pregnancy test positive 05/12/2018   Tobacco use 05/12/2018   Depression, major, single episode, moderate ([place]) 05/12/2018   Health supervision of infant or child 06/27/2011   Paresthesia of left leg 06/27/2011   Polydipsia 06/27/2011    Past Surgical History:  Procedure Laterality Date   NO PAST SURGERIES  05/2018     OB History    Gravida  3   Para  1   Term  1   Preterm      AB  1   Living  0     SAB  1   TAB      Ectopic      Multiple      Live Births  1           Family History  Problem Relation  Age of Onset   Cholelithiasis Mother    Hypertension Mother    Cancer Maternal Aunt        breast   Heart disease Maternal Grandmother    Drug abuse Maternal Grandmother    Hypertension Other    Hyperlipidemia Other    Heart disease Daughter        hypoplastic left heart, double right ventricle, pulmonary stenosis, other congenital heart disease   Cancer Paternal Grandfather        skin   Stroke Neg Hx     Social History   Tobacco Use   Smoking status: Former Smoker    Types: E-cigarettes   Smokeless tobacco: Never Used   Tobacco comment: juul  Substance Use Topics   Alcohol use: No   Drug use: No    Home Medications Prior to Admission medications   Medication Sig Start Date End Date Taking? Authorizing Provider  albuterol (PROVENTIL HFA;VENTOLIN HFA) 108 (90 Base) MCG/ACT inhaler Inhale 2 puffs into the lungs every 6 (six) hours as needed for wheezing or shortness of breath. 05/12/18   Tysinger, Camelia Eng, PA-C  aspirin EC 81 MG tablet Take 81 mg by mouth  daily.    [provider]  Butalbital-APAP-Caffeine 50-325-40 MG capsule Take 1 capsule by mouth every 6 (six) hours as needed for headache. 11/11/18   Aviva Signs, CNM  cephALEXin (KEFLEX) 500 MG capsule Take 1 capsule (500 mg total) by mouth 3 (three) times daily. 11/26/18   Leftwich-Kirby, Wilmer Floor, CNM  ferrous sulfate 325 (65 FE) MG tablet Take 325 mg by mouth daily with breakfast.    [provider]  hydrocortisone cream 1 % Apply to affected area 2 times daily 07/28/18   Calvert Cantor, CNM  methocarbamol (ROBAXIN) 500 MG tablet Take 1 tablet (500 mg total) by mouth every 8 (eight) hours as needed for muscle spasms. 09/03/19   Uzziah Rigg, PA-C  metoCLOPramide (REGLAN) 5 MG tablet Take 1 tablet (5 mg total) by mouth every 6 (six) hours. 08/26/18   Aviva Kluver B, PA-C  polyethylene glycol (MIRALAX) packet Take 17 g by mouth daily. 07/28/18   Calvert Cantor, CNM  Prenatal  Vit-Fe Fumarate-FA (PRENATAL VITAMINS) 28-0.8 MG TABS Take 1 tablet by mouth daily. 05/12/18   Tysinger, Kermit Balo, PA-C  Witch Hazel (TUCKS) 50 % PADS Apply 1 each topically daily as needed. 07/28/18   Calvert Cantor, CNM    Allergies    Patient has no known allergies.  Review of Systems   Review of Systems  Constitutional: Positive for appetite change, chills and fever.  HENT: Negative for ear pain, rhinorrhea, sneezing and sore throat.   Eyes: Negative for photophobia and visual disturbance.  Respiratory: Negative for cough, chest tightness, shortness of breath and wheezing.   Cardiovascular: Negative for chest pain and palpitations.  Gastrointestinal: Negative for abdominal pain, blood in stool, constipation, diarrhea, nausea and vomiting.  Genitourinary: Negative for dysuria, hematuria and urgency.  Musculoskeletal: Positive for back pain and myalgias.  Skin: Negative for rash.  Neurological: Negative for dizziness, weakness and light-headedness.    Physical Exam Updated Vital Signs BP 123/73 (BP Location: Left Arm)    Pulse (!) 105    Temp (!) 101 F (38.3 C) (Oral)    Resp 18    SpO2 97%   Physical Exam Vitals and nursing note reviewed.  Constitutional:      General: She is not in acute distress.    Appearance: She is well-developed.  HENT:     Head: Normocephalic and atraumatic.     Nose: Nose normal.  Eyes:     General: No scleral icterus.       Right eye: No discharge.        Left eye: No discharge.     Conjunctiva/sclera: Conjunctivae normal.  Cardiovascular:     Rate and Rhythm: Regular rhythm. Tachycardia present.     Heart sounds: Normal heart sounds. No murmur. No friction rub. No gallop.   Pulmonary:     Effort: Pulmonary effort is normal. No respiratory distress.     Breath sounds: Normal breath sounds.  Abdominal:     General: Bowel sounds are normal. There is no distension.     Palpations: Abdomen is soft.     Tenderness: There is no abdominal  tenderness. There is no guarding.  Musculoskeletal:        General: Normal range of motion.     Cervical back: Normal range of motion and neck supple.       Back:     Comments: No midline spinal tenderness present in lumbar, thoracic or cervical spine. No step-off palpated. No visible bruising,  edema or temperature change noted. No objective signs of numbness present. No saddle anesthesia. 2+ DP pulses bilaterally. Sensation intact to light touch. Strength 5/5 in bilateral lower extremities.  Skin:    General: Skin is warm and dry.     Findings: No rash.  Neurological:     Mental Status: She is alert.     Motor: No abnormal muscle tone.     Coordination: Coordination normal.     ED Results / Procedures / Treatments   Labs (all labs ordered are listed, but only abnormal results are displayed) Labs Reviewed  URINALYSIS, ROUTINE W REFLEX MICROSCOPIC - Abnormal; Notable for the following components:      Result Value   Hgb urine dipstick LARGE (*)    Bacteria, UA RARE (*)    All other components within normal limits  RESPIRATORY PANEL BY RT PCR (FLU A&B, COVID)  URINE CULTURE  PREGNANCY, URINE  POC SARS CORONAVIRUS 2 AG -  ED    EKG None  Radiology No results found.  Procedures Procedures (including critical care time)  Medications Ordered in ED Medications  oxyCODONE-acetaminophen (PERCOCET/ROXICET) 5-325 MG per tablet 1 tablet (1 tablet Oral Given 09/03/19 1552)  sodium chloride 0.9 % bolus 1,000 mL (0 mLs Intravenous Stopped 09/03/19 1811)  acetaminophen (TYLENOL) tablet 650 mg (650 mg Oral Given 09/03/19 1704)  methocarbamol (ROBAXIN) tablet 500 mg (500 mg Oral Given 09/03/19 1704)    ED Course  I have reviewed the triage vital signs and the nursing notes.  Pertinent labs & imaging results that were available during my care of the patient were reviewed by me and considered in my medical decision making (see chart for details).    MDM Rules/Calculators/A&P                       Naba Sneed was evaluated in Emergency Department on 09/03/19 for the symptoms described in the history of present illness. He/she was evaluated in the context of the global COVID-19 pandemic, which necessitated consideration that the patient might be at risk for infection with the SARS-CoV-2 virus that causes COVID-19. Institutional protocols and algorithms that pertain to the evaluation of patients at risk for COVID-19 are in a state of rapid change based on information released by regulatory bodies including the CDC and federal and state organizations. These policies and algorithms were followed during the patient's care in the ED.  Patient denies any concerning symptoms suggestive of cauda equina requiring urgent imaging at this time such as loss of sensation in the lower extremities, lower extremity weakness, loss of bowel or bladder control, saddle anesthesia, urinary retention, fever/chills, IVDU. Exam demonstrated no  weakness on exam today. No preceding injury or trauma to suggest acute fracture. Doubt pelvic or urinary pathology for patient's acute back pain, as patient denies urinary symptoms, has no evidence of infection on UA, has no CVA tenderness, history/pain not consistent with nephrolithiasis, has no vaginal discharge, and is not pregnant as evidenced by negative POC urine pregnancy test. Doubt AAA as cause of patient's back pain as patient lacks  major risk factors, had no abdominal TTP, and has symmetric and intact distal pulses.  Suspect that her symptoms are viral in nature and that her back pain could be due to spasms and lumbar strain from sleeping position or holding and lifting her baby.  She is breast-feeding but I consulted with pharmacist who states that methocarbamol is safe in breast-feeding and it is more reassuring that  patient is at most breast-feeding her child once a day but will sometimes go days without breast-feeding and will only use bottlefeeding.  Patient was  told to take methocarbamol prior to breast-feeding and if she needs to breast-feed afterwards, to wait 2 hours.  She appears much improved after medications given here, her temperature has improved to 100.3 and her tachycardia has improved as well on my recheck.  Patient given strict return precautions for any symptoms indicating worsening neurologic function in the lower extremities.  Patient is hemodynamically stable, in NAD, and able to ambulate in the ED. Evaluation does not show pathology that would require ongoing emergent intervention or inpatient treatment. I explained the diagnosis to the patient. Pain has been managed and has no complaints prior to discharge. Patient is comfortable with above plan and is stable for discharge at this time. All questions were answered prior to disposition. Strict return precautions for returning to the ED were discussed. Encouraged follow up with PCP.   An After Visit Summary was printed and given to the patient.   Portions of this note were generated with Scientist, clinical (histocompatibility and immunogenetics). Dictation errors may occur despite best attempts at proofreading.  Final Clinical Impression(s) / ED Diagnoses Final diagnoses:  Influenza-like illness  Strain of lumbar region, initial encounter    Rx / DC Orders ED Discharge Orders         Ordered    methocarbamol (ROBAXIN) 500 MG tablet  Every 8 hours PRN     09/03/19 1843           Dietrich Pates, PA-C 09/03/19 1847    Charlynne Pander, MD 09/03/19 2231

## 2019-09-03 NOTE — ED Triage Notes (Signed)
Pt here from home with c/o lower back pain after picking up and almost dropping her baby , pt also c/o aches all over and fever of 101

## 2019-09-04 LAB — URINE CULTURE: Culture: NO GROWTH

## 2019-09-04 IMAGING — US US RENAL
2 series · 14 of 25 positions shown · non-contrast
Comparison: Right upper quadrant ultrasound dated 08/25/2018

CLINICAL DATA: Right hydronephrosis demonstrated on right upper
quadrant ultrasound dated 08/25/2018.

EXAM:
RENAL / URINARY TRACT ULTRASOUND COMPLETE

[Series 1: us renal · 0.23mm/px · 13 of 32 slices shown (1 of 2)]
[im 1/32]
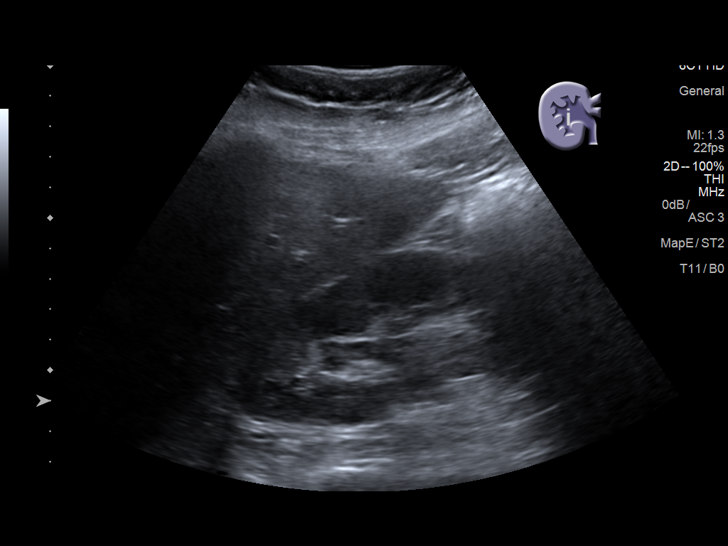
[im 3/32]
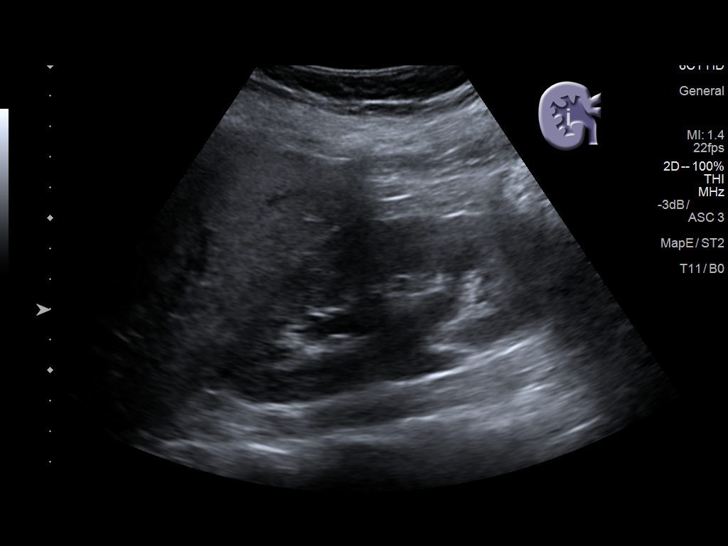
[im 6/32]
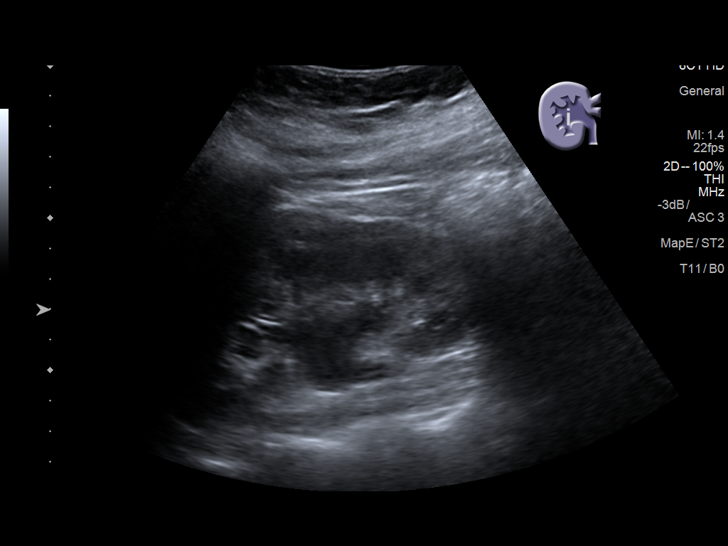
[im 9/32]
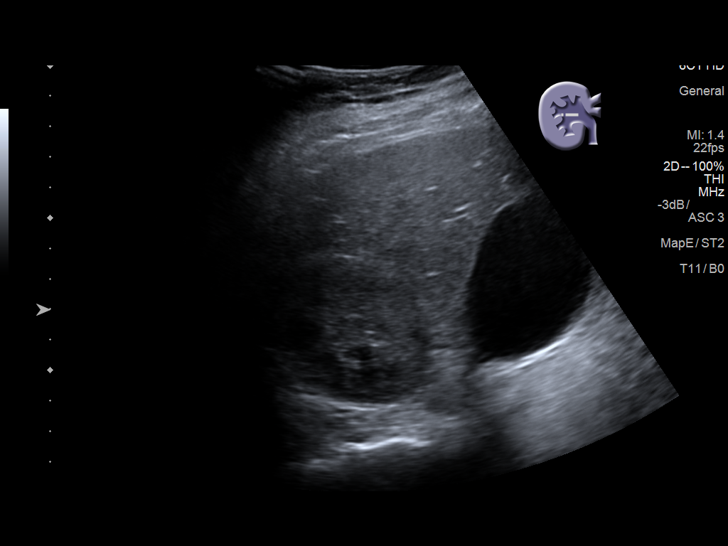
[im 11/32]
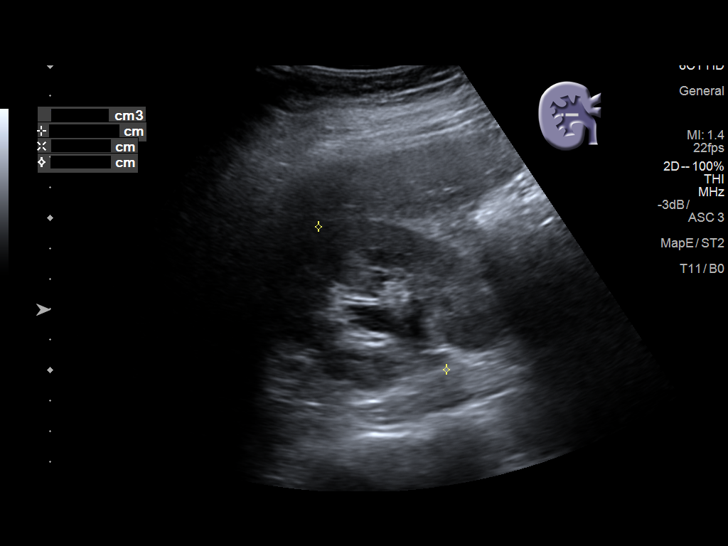
[im 13/32]
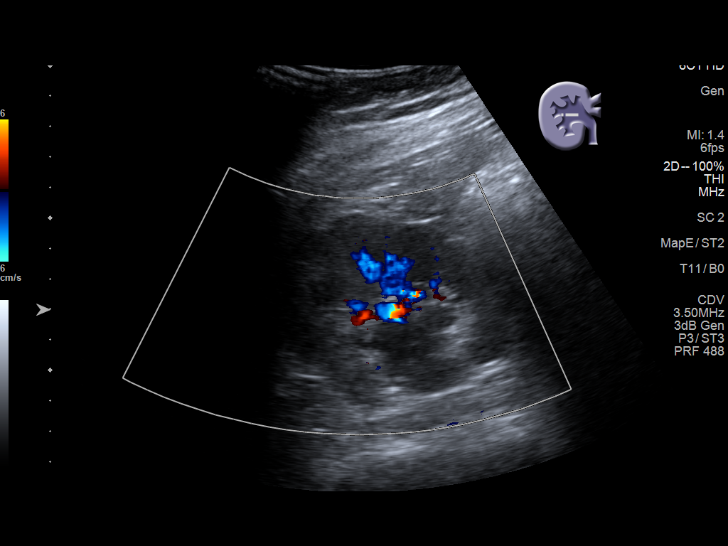
[im 15/32]
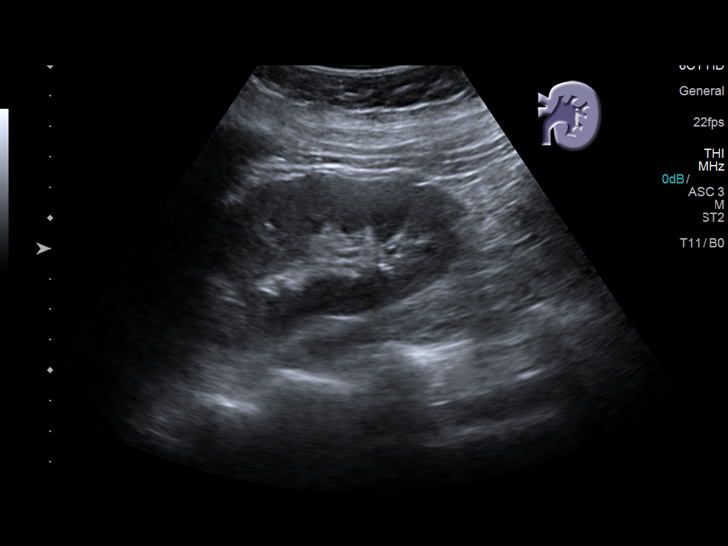
[im 18/32]
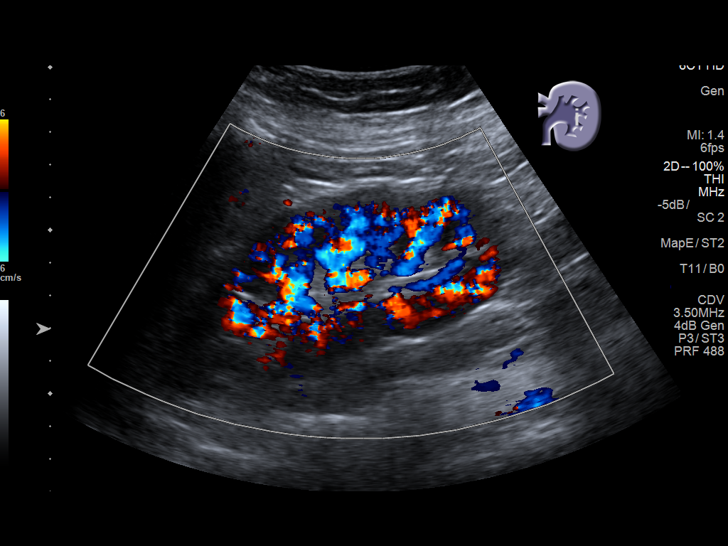
[im 21/32]
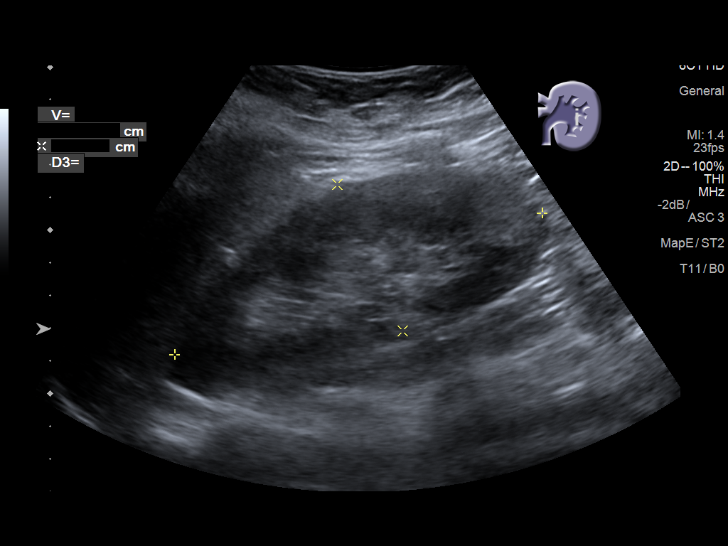
[im 22/32]
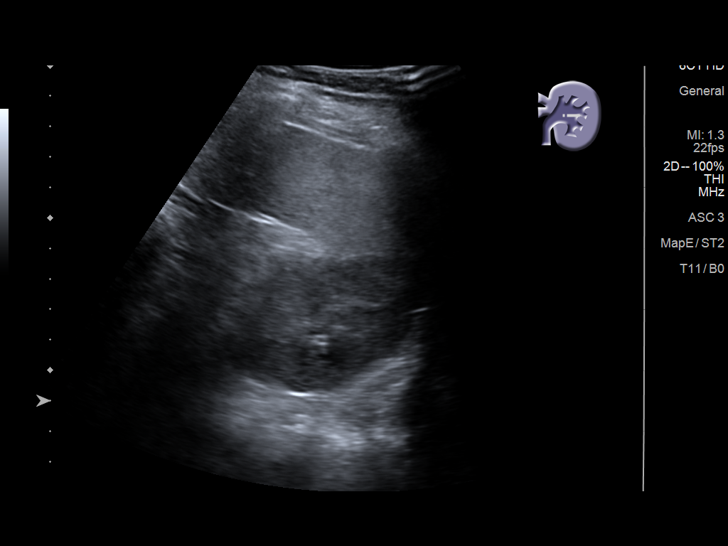
[im 25/32]
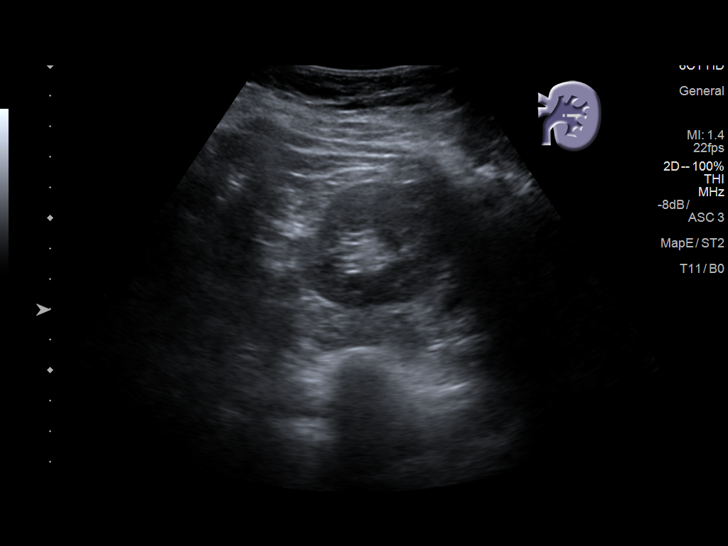
[im 27/32]
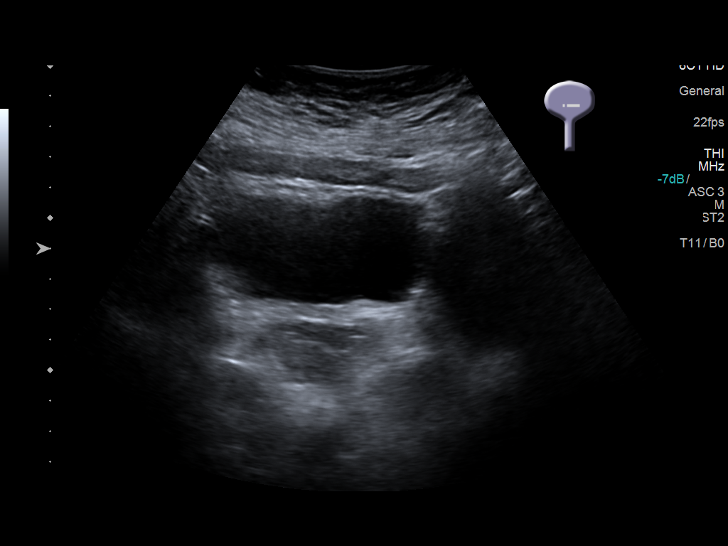
[im 30/32]
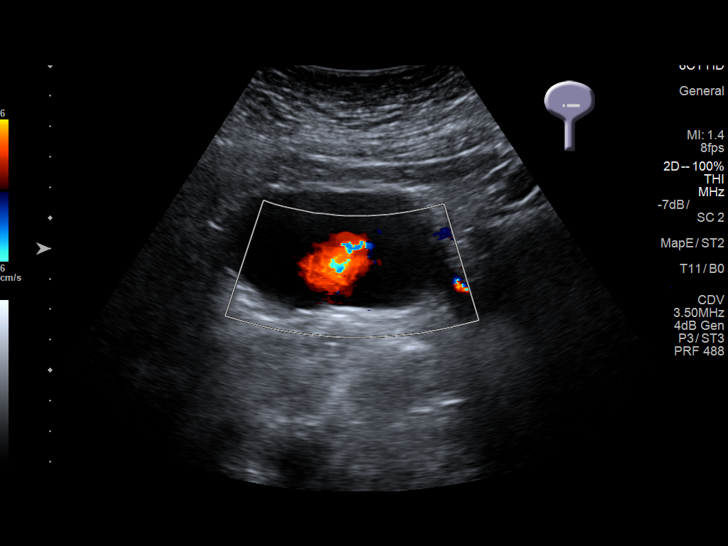

[Series 2: us renal · 0.23mm/px · 1 of 1 slices shown (2 of 2)]
[im 1/1]
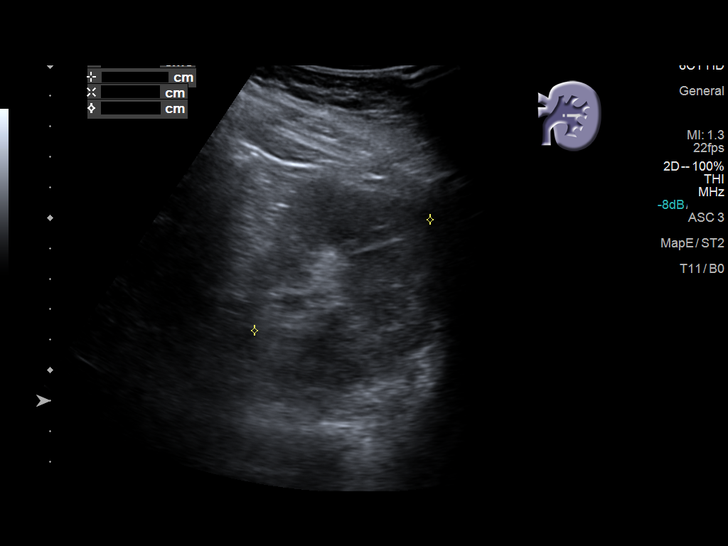

[14 of 25 positions shown; findings below may reference images not displayed]

FINDINGS: Right Kidney:

Renal measurements: 12.2 x 5.2 x 6.3 cm = volume: 210 mL .
Echogenicity within normal limits. There is mild right
hydronephrosis. No mass lesion. Right ureteral jet is identified in
the bladder.

Left Kidney:

Renal measurements: 12.1 x 4.9 x 6.8 cm = volume: 211 mL.
Echogenicity within normal limits. No mass or hydronephrosis
visualized.

Bladder:

Appears normal for degree of bladder distention. Bilateral ureteral
jets identified.
IMPRESSION: 1. Mild right hydronephrosis, etiology unknown. The right ureter is
patent as indicated by a right ureteral jet identified in the
bladder.
2. Normal appearing left kidney.

## 2020-05-28 ENCOUNTER — Other Ambulatory Visit: Payer: Self-pay

## 2020-05-28 ENCOUNTER — Encounter (HOSPITAL_COMMUNITY): Payer: Self-pay | Admitting: Emergency Medicine

## 2020-05-28 ENCOUNTER — Emergency Department (HOSPITAL_COMMUNITY)
Admission: EM | Admit: 2020-05-28 | Discharge: 2020-05-28 | Disposition: A | Discharge status: Home / Self Care | Payer: Medicaid Other | Attending: Emergency Medicine | Admitting: Emergency Medicine

## 2020-05-28 DIAGNOSIS — S0993XA Unspecified injury of face, initial encounter: Secondary | ICD-10-CM

## 2020-05-28 DIAGNOSIS — S01411A Laceration without foreign body of right cheek and temporomandibular area, initial encounter: Secondary | ICD-10-CM | POA: Insufficient documentation

## 2020-05-28 DIAGNOSIS — S0181XA Laceration without foreign body of other part of head, initial encounter: Secondary | ICD-10-CM

## 2020-05-28 DIAGNOSIS — J45909 Unspecified asthma, uncomplicated: Secondary | ICD-10-CM | POA: Insufficient documentation

## 2020-05-28 DIAGNOSIS — Z7982 Long term (current) use of aspirin: Secondary | ICD-10-CM | POA: Insufficient documentation

## 2020-05-28 MED ORDER — IBUPROFEN 200 MG PO TABS
600.0000 mg | ORAL_TABLET | Freq: Once | ORAL | Status: AC
  Administered 2020-05-28: 600 mg via ORAL
  Filled 2020-05-28: qty 3

## 2020-05-28 MED ORDER — IBUPROFEN 600 MG PO TABS: 600.0000 mg | ORAL_TABLET | Freq: Four times a day (QID) | ORAL | 0 refills | Status: AC | PRN

## 2020-05-28 NOTE — Discharge Instructions (Addendum)
  Wound Care - Dermabond  Apply cold pack or ice pack to the area of swelling for about 15 minutes at a time a couple times a day.  Keep the area of the body elevated to reduce swelling, in this case it may mean sleeping elevated on more pillows.  Your wound has been closed with a medical-grade glue called Dermabond. Please adhere to the following wound care instructions:  You may shower, but avoid submerging the wound. Do not scrub the wound, as this may cause the glue to wear off prematurely.    The glue will wear off on its own, usually within 5-10 days. During this time period DO NOT apply antibiotic ointments or any other ointments or lotions to the area as this can cause the glue to wear off prematurely.  After 10 days, you may apply ointments, such as Neosporin, to the area to help the remaining glue to wear off.   After the wound has healed and the glue is gone, you may apply ointments such as Aquaphor to the wound to reduce scarring.  May use ibuprofen, naproxen, or Tylenol for pain. Antiinflammatory medications: Take 600 mg of ibuprofen every 6 hours or 440 mg (over the counter dose) to 500 mg (prescription dose) of naproxen every 12 hours for the next 3 days. After this time, these medications may be used as needed for pain. Take these medications with food to avoid upset stomach. Choose only one of these medications, do not take them together. Acetaminophen (generic for Tylenol): Should you continue to have additional pain while taking the ibuprofen or naproxen, you may add in acetaminophen as needed. Your daily total maximum amount of acetaminophen from all sources should be limited to 4000mg /day for persons without liver problems, or 2000mg /day for those with liver problems.  Return to the ED should the wound edges come apart or signs of infection arise, such as spreading redness, puffiness/swelling, pus draining from the wound, severe increase in pain, or any other major  issues.  For prescription assistance, may try using prescription discount sites or apps, such as goodrx.com
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
## 2020-05-28 NOTE — ED Notes (Signed)
[employer] speaking with patient.  
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
## 2020-06-11 IMAGING — CT CT ANGIO CHEST
2 of 9 series · 17 of 46 positions shown · IV contrast (iopamidol)
Comparison: Chest radiographs dated 08/25/2018.

CLINICAL DATA: Right-sided chest pain, nausea/vomiting, possible
aspiration. Pregnant.

EXAM:
CT ANGIOGRAPHY CHEST WITH CONTRAST
TECHNIQUE: Multidetector CT imaging of the chest was performed using the
standard protocol during bolus administration of intravenous
contrast. Multiplanar CT image reconstructions and MIPs were
obtained to evaluate the vascular anatomy.
CONTRAST:  100mL 6BMN7Y-MSD IOPAMIDOL (6BMN7Y-MSD) INJECTION 76%

[Series 7: thins · axial · 0.80mm/px · z∈[+917,+1134]mm · 14 of 239 slices shown]
[im 11/239  lung]
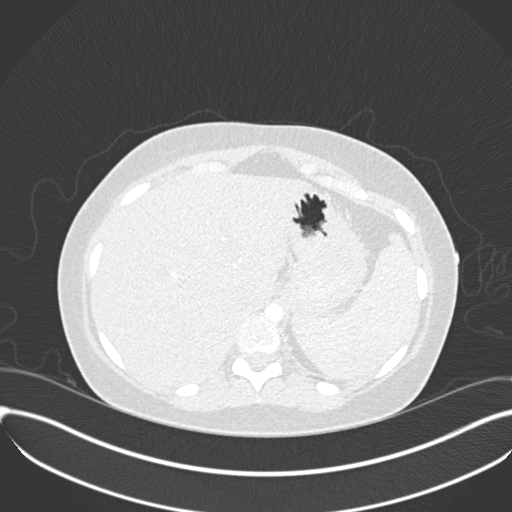
[im 33/239  soft-tissue]
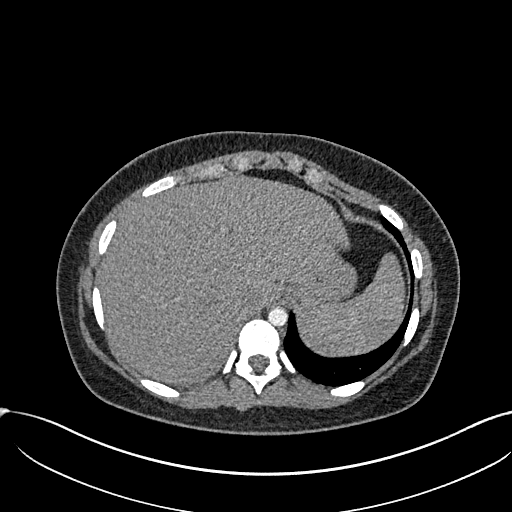
[im 44/239  lung]
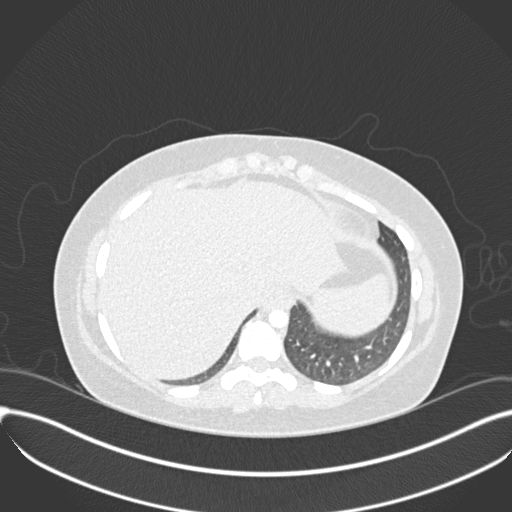
[im 65/239  soft-tissue]
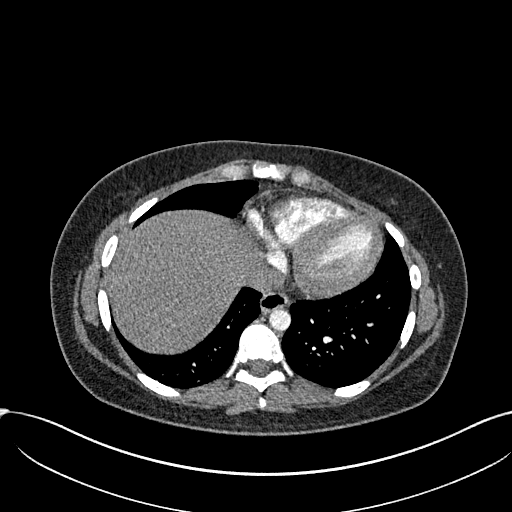
[im 76/239  lung]
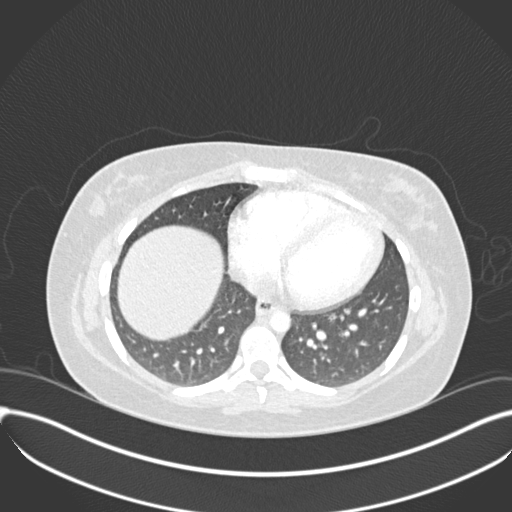
[im 98/239  soft-tissue]
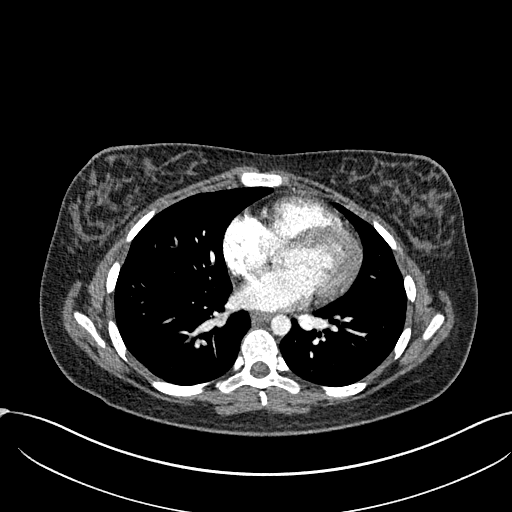
[im 109/239  lung]
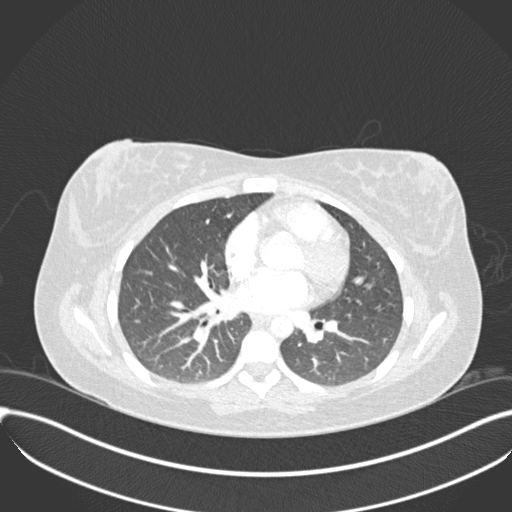
[im 130/239  soft-tissue]
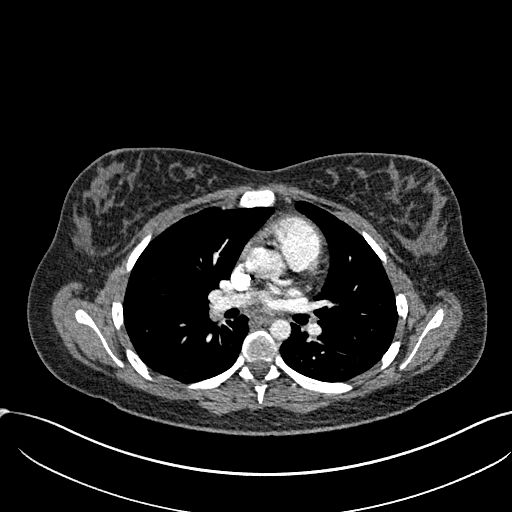
[im 141/239  lung]
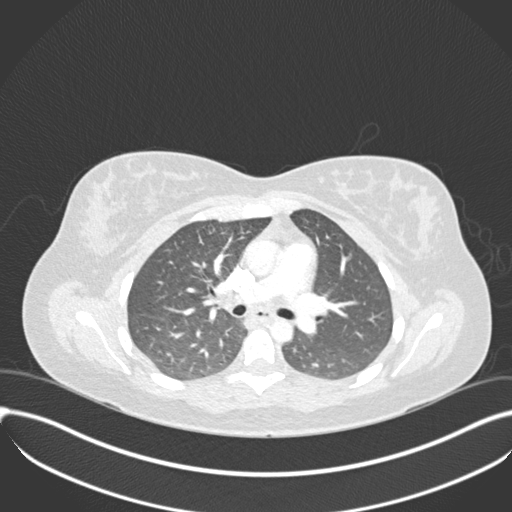
[im 163/239  soft-tissue]
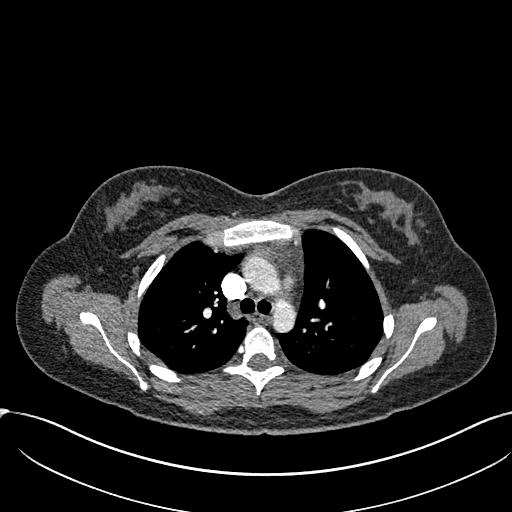
[im 174/239  lung]
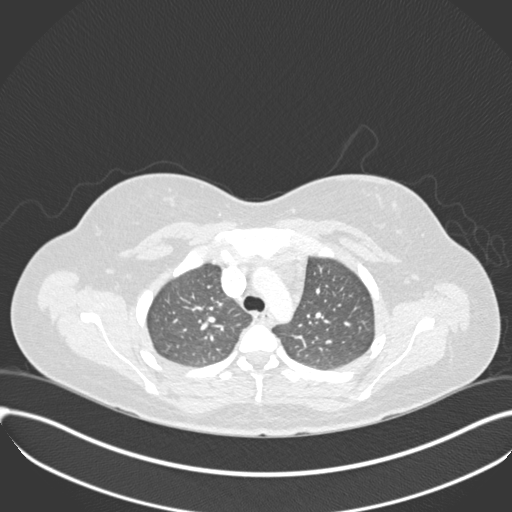
[im 195/239  soft-tissue]
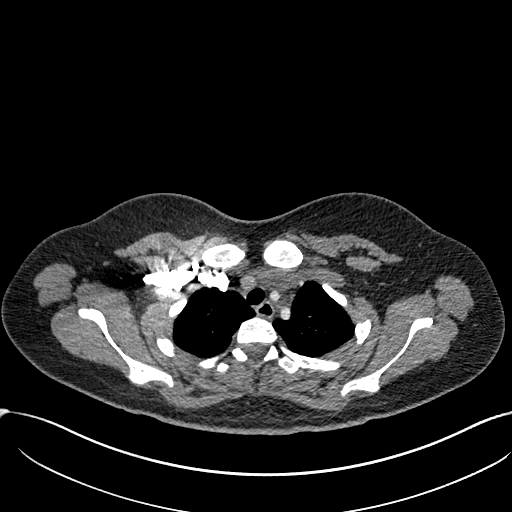
[im 206/239  lung]
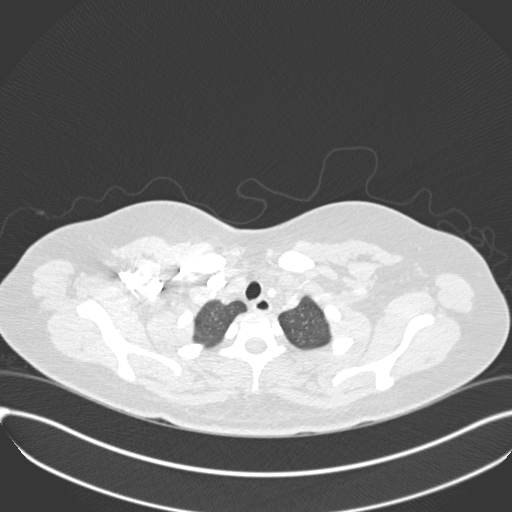
[im 228/239  soft-tissue]
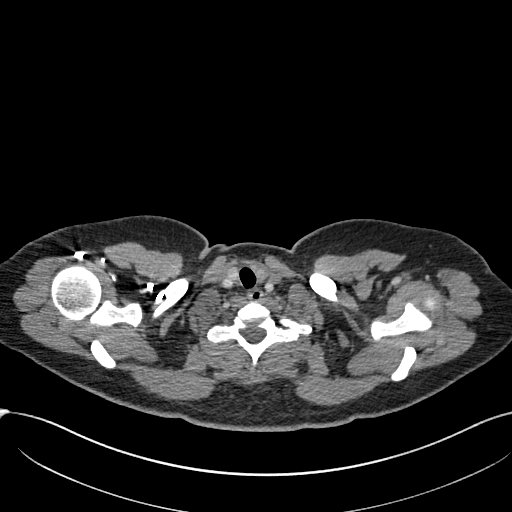

[Series 9: coronal mpr · coronal · 0.48mm/px · 3 of 131 slices shown]
[im 33/131  soft-tissue]
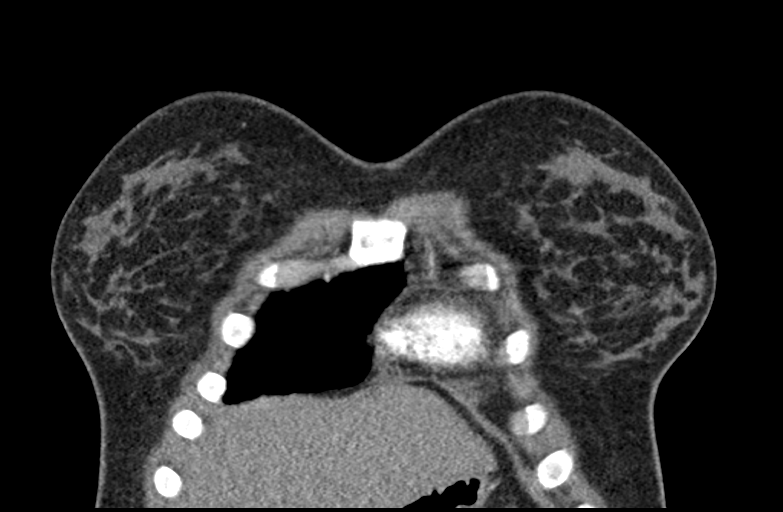
[im 66/131  soft-tissue]
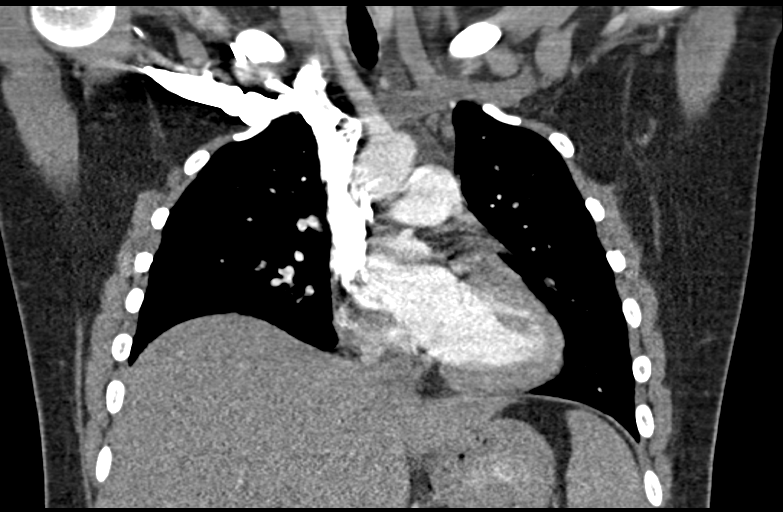
[im 98/131  soft-tissue]
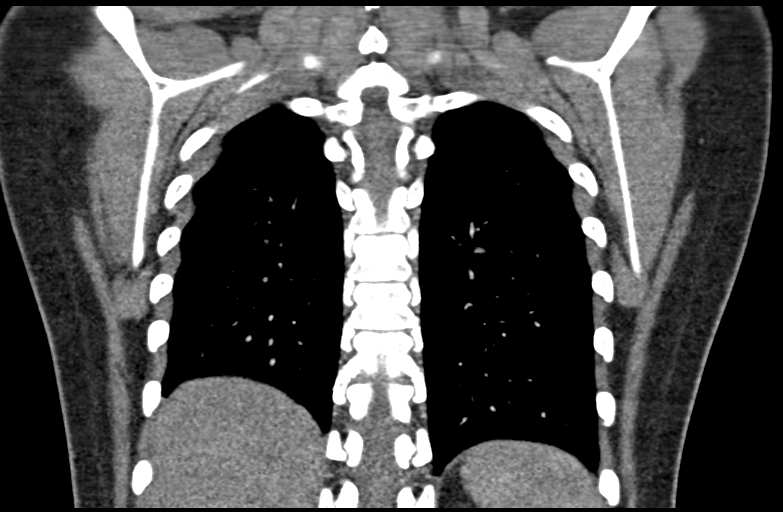

[17 of 46 positions shown; findings below may reference images not displayed]

FINDINGS: Cardiovascular: Satisfactory opacification of the bilateral
pulmonary arteries to the segmental level. No evidence of pulmonary
embolism.

The heart is normal in size.  No pericardial effusion.

No evidence of thoracic aortic aneurysm.

Mediastinum/Nodes: No suspicious mediastinal lymphadenopathy.

No esophageal wall thickening or pneumomediastinum to suggest
esophageal perforation. No extraluminal contrast is evident, noting
that only minimal contrast is in the stomach due to patient vomiting
following Gastrografin administration.

Visualized thyroid is unremarkable.

Lungs/Pleura: Lungs are clear.

No findings suspicious for infection/aspiration.

No suspicious pulmonary nodules.

No pleural effusion or pneumothorax.

Upper Abdomen: Visualized upper abdomen is unremarkable.

Musculoskeletal: Visualized osseous structures are within normal
limits..

Review of the MIP images confirms the above findings.
IMPRESSION: No evidence of pulmonary embolism.

No findings to suggest esophageal perforation.

No evidence of infection/aspiration.

Negative CT chest.

## 2021-03-05 IMAGING — US US PELVIS COMPLETE
1 series · 13 of 25 positions shown · non-contrast
Comparison: MRI abdomen and pelvis dated August 25, 2018. OB
ultrasound dated May 24, 2018.

CLINICAL DATA: Pelvic pain with irregular vaginal bleeding for the
past 3 days.

EXAM:
TRANSABDOMINAL AND TRANSVAGINAL ULTRASOUND OF PELVIS
DOPPLER ULTRASOUND OF OVARIES
TECHNIQUE: Both transabdominal and transvaginal ultrasound examinations of the
pelvis were performed. Transabdominal technique was performed for
global imaging of the pelvis including uterus, ovaries, adnexal
regions, and pelvic cul-de-sac.
It was necessary to proceed with endovaginal exam following the
transabdominal exam to visualize the uterus, endometrium, ovaries,
and adnexa. Color and duplex Doppler ultrasound was utilized to
evaluate blood flow to the ovaries.

[Series 1: us pelvis complete · 13 of 48 slices shown]
[im 1/48]
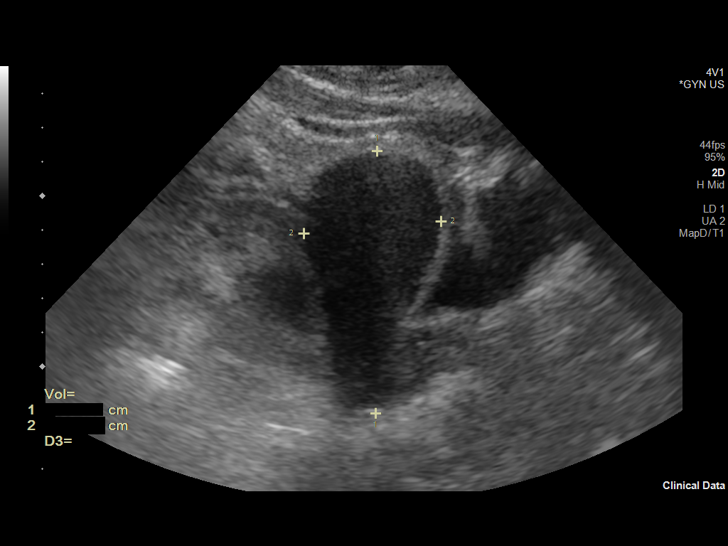
[im 4/48]
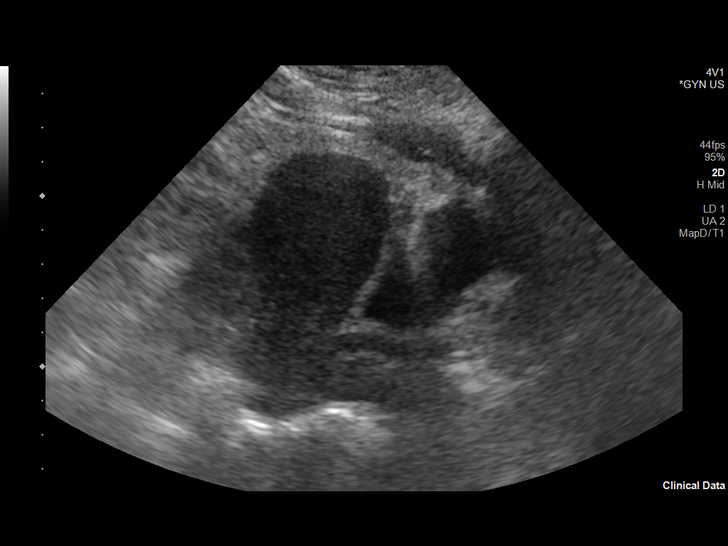
[im 8/48]
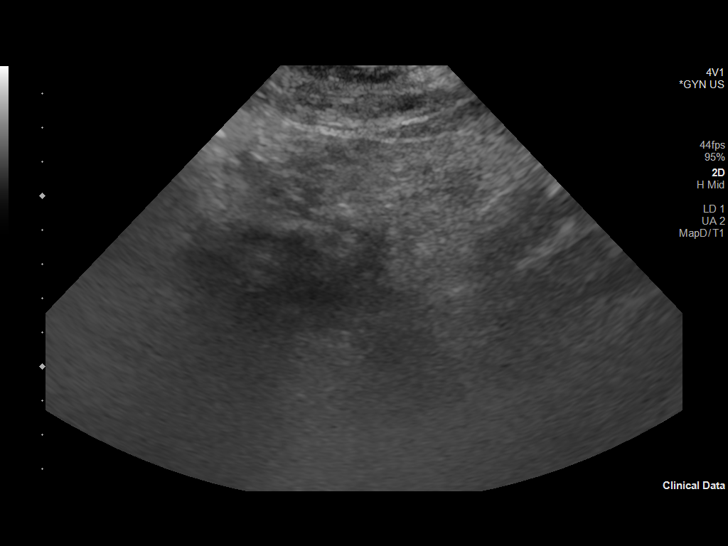
[im 12/48]
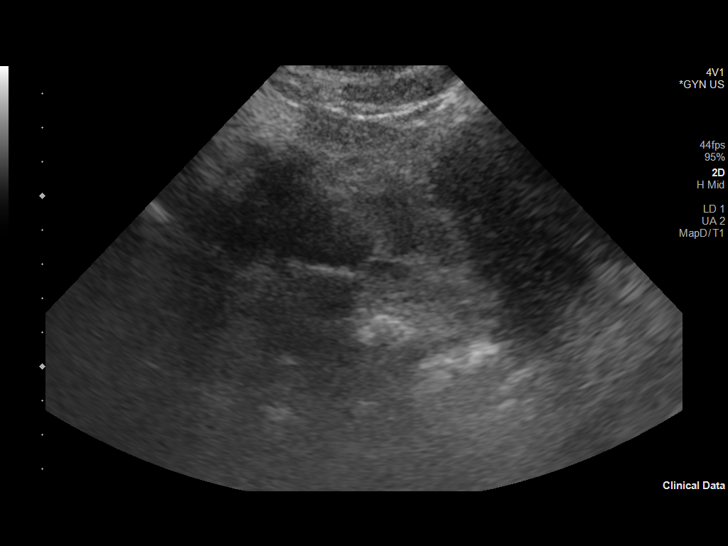
[im 16/48]
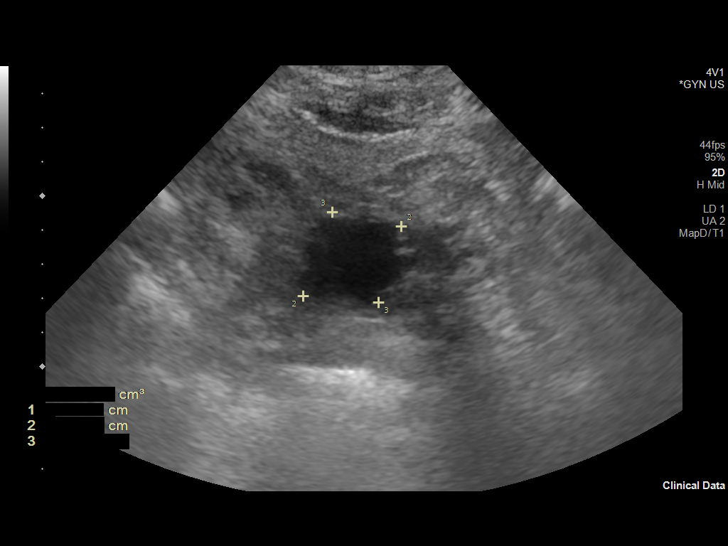
[im 20/48]
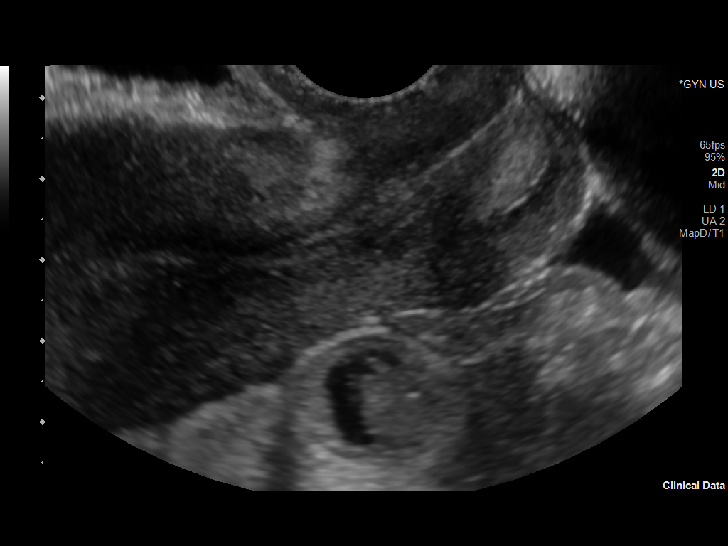
[im 24/48]
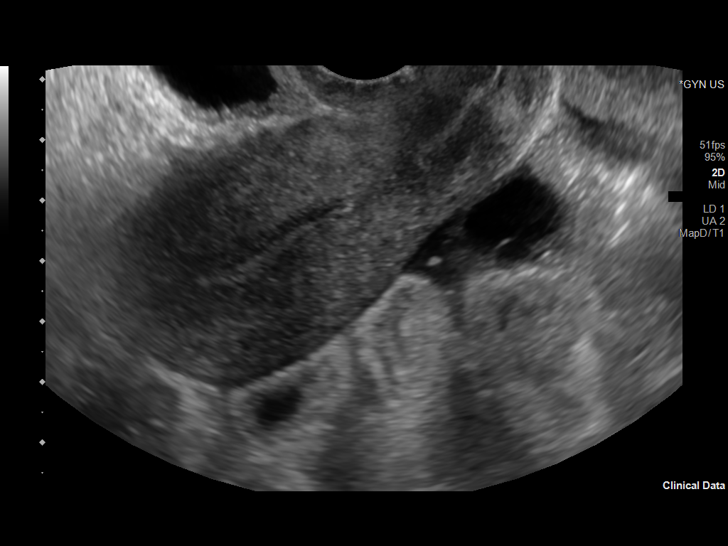
[im 28/48]
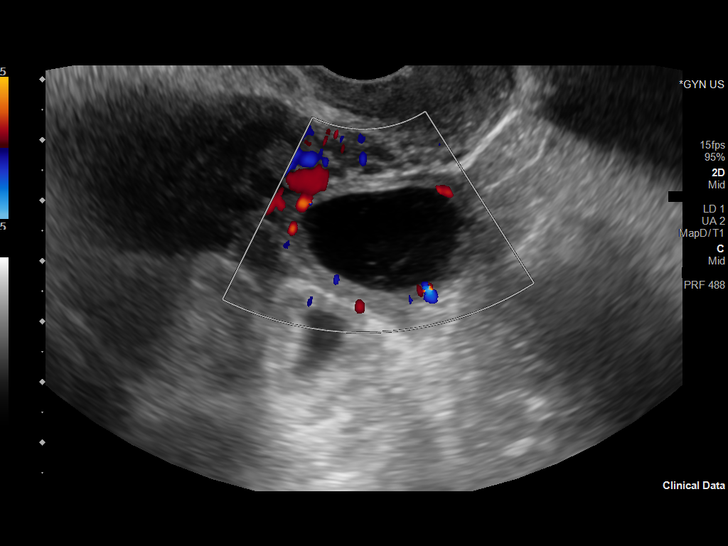
[im 32/48]
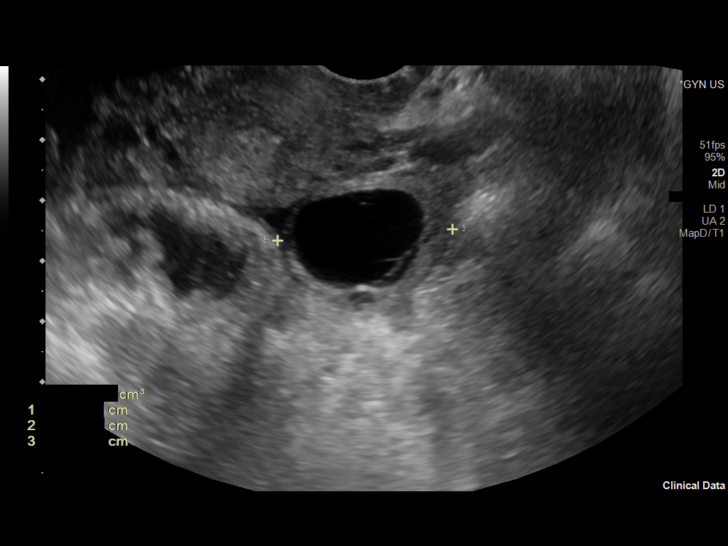
[im 36/48]
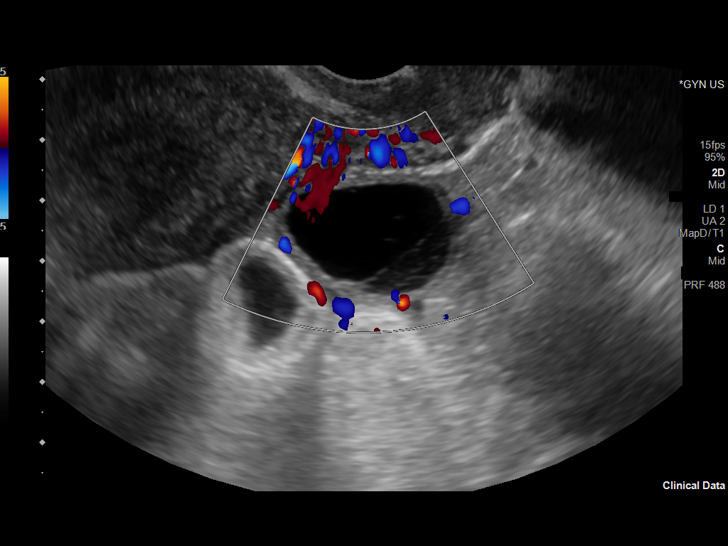
[im 40/48]
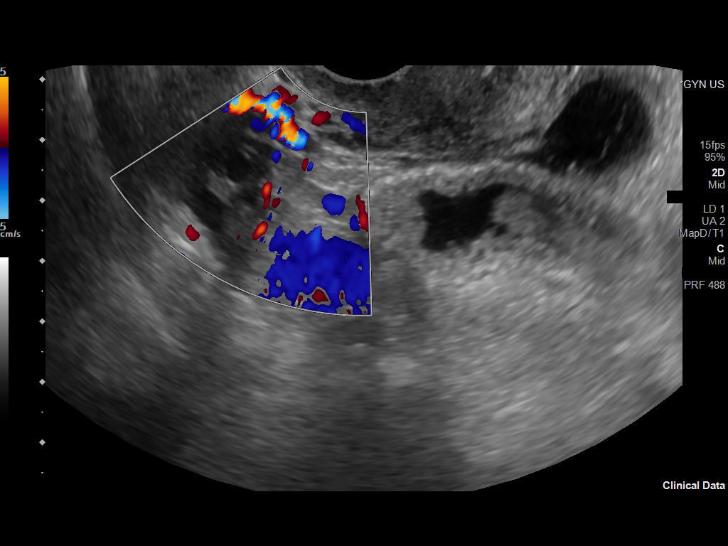
[im 44/48]
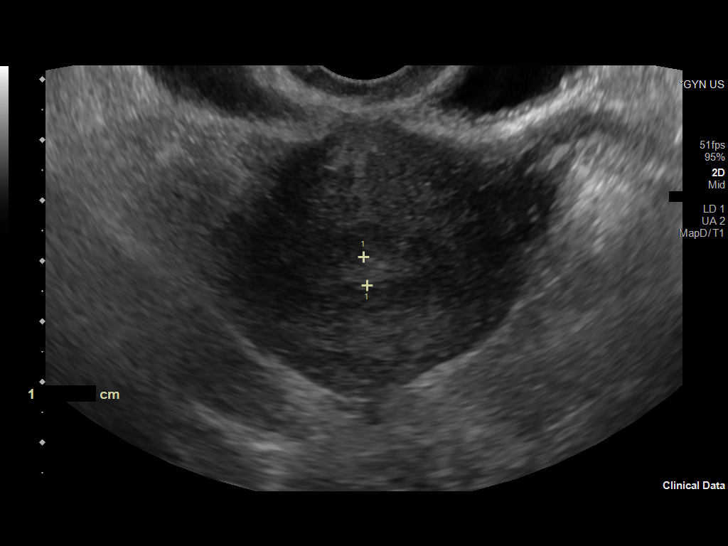
[im 48/48]
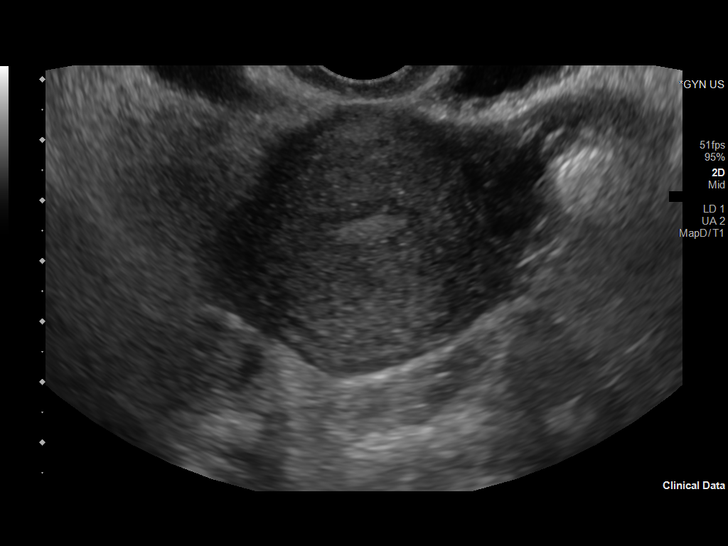

[13 of 25 positions shown; findings below may reference images not displayed]

FINDINGS: Uterus

Measurements: 8.0 x 3.8 x 4.7 cm = volume: 73 mL. No fibroids or
other mass visualized.

Endometrium

Thickness: 4 mm.  No focal abnormality visualized.

Right ovary

Measurements: 2.4 x 7.3 x 1.4 cm = volume: 2 mL. Normal
appearance/no adnexal mass.

Left ovary

Measurements: 3.6 x 2.6 x 2.9 cm = volume: 14 mL. 2.6 cm hemorrhagic
cyst.

Pulsed Doppler evaluation of both ovaries demonstrates normal
low-resistance arterial and venous waveforms.

Other findings

Trace free fluid in the pelvis, likely physiologic.
IMPRESSION: 1. 2.6 cm hemorrhagic cyst in the left ovary. No follow-up required.
This recommendation follows the consensus statement: Management of
Asymptomatic Ovarian and Other Adnexal Cysts Imaged at US: Society
of Radiologists in Ultrasound Consensus Conference Statement.
2. Otherwise normal pelvic ultrasound.
